# Patient Record
Sex: Male | Born: 1955 | Race: Black or African American | Hispanic: No | Marital: Single | Smoking: Current every day smoker
Health system: Southern US, Community
[De-identification: ages and names within clinical notes are randomized; demographics above are authoritative.]

## PROBLEM LIST (undated history)

## (undated) DIAGNOSIS — I10 Essential (primary) hypertension: Secondary | ICD-10-CM

## (undated) DIAGNOSIS — H9190 Unspecified hearing loss, unspecified ear: Secondary | ICD-10-CM

## (undated) HISTORY — PX: FOOT SURGERY: SHX648

## (undated) HISTORY — PX: FRACTURE SURGERY: SHX138

## (undated) HISTORY — DX: Essential (primary) hypertension: I10

---

## 2003-12-24 ENCOUNTER — Emergency Department (HOSPITAL_COMMUNITY): Admission: EM | Admit: 2003-12-24 | Discharge: 2003-12-24 | Payer: Self-pay

## 2005-05-27 ENCOUNTER — Inpatient Hospital Stay (HOSPITAL_COMMUNITY): Admission: AC | Admit: 2005-05-27 | Discharge: 2005-06-09 | Payer: Self-pay

## 2005-06-08 ENCOUNTER — Ambulatory Visit: Payer: Self-pay | Admitting: Critical Care Medicine

## 2005-07-25 ENCOUNTER — Ambulatory Visit: Payer: Self-pay | Admitting: Critical Care Medicine

## 2005-09-22 ENCOUNTER — Ambulatory Visit: Payer: Self-pay | Admitting: Critical Care Medicine

## 2006-01-18 ENCOUNTER — Ambulatory Visit: Payer: Self-pay | Admitting: Critical Care Medicine

## 2010-12-01 ENCOUNTER — Ambulatory Visit (INDEPENDENT_AMBULATORY_CARE_PROVIDER_SITE_OTHER): Payer: PRIVATE HEALTH INSURANCE | Admitting: Gastroenterology

## 2010-12-01 DIAGNOSIS — B182 Chronic viral hepatitis C: Secondary | ICD-10-CM

## 2010-12-08 ENCOUNTER — Other Ambulatory Visit: Payer: Self-pay | Admitting: Gastroenterology

## 2010-12-08 DIAGNOSIS — B182 Chronic viral hepatitis C: Secondary | ICD-10-CM
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
## 2010-12-20 ENCOUNTER — Other Ambulatory Visit: Payer: Self-pay | Admitting: Interventional Radiology

## 2010-12-20 ENCOUNTER — Ambulatory Visit (HOSPITAL_COMMUNITY)
Admission: RE | Admit: 2010-12-20 | Discharge: 2010-12-20 | Payer: PRIVATE HEALTH INSURANCE | Source: Ambulatory Visit | Attending: Gastroenterology | Admitting: Gastroenterology

## 2010-12-20 ENCOUNTER — Ambulatory Visit (HOSPITAL_COMMUNITY)
Admission: RE | Admit: 2010-12-20 | Discharge: 2010-12-20 | Disposition: A | Discharge status: Home / Self Care | Payer: PRIVATE HEALTH INSURANCE | Source: Ambulatory Visit | Attending: Gastroenterology | Admitting: Gastroenterology

## 2010-12-20 DIAGNOSIS — B1920 Unspecified viral hepatitis C without hepatic coma: Secondary | ICD-10-CM | POA: Insufficient documentation

## 2010-12-20 DIAGNOSIS — Z79899 Other long term (current) drug therapy: Secondary | ICD-10-CM | POA: Insufficient documentation

## 2010-12-20 DIAGNOSIS — I10 Essential (primary) hypertension: Secondary | ICD-10-CM | POA: Insufficient documentation

## 2010-12-20 DIAGNOSIS — B182 Chronic viral hepatitis C: Secondary | ICD-10-CM

## 2010-12-20 LAB — CBC
Hemoglobin: 12.1 g/dL — ABNORMAL LOW (ref 13.0–17.0)
MCH: 32.4 pg (ref 26.0–34.0)
MCV: 92.2 fL (ref 78.0–100.0)
Platelets: 191 10*3/uL (ref 150–400)
RBC: 3.73 MIL/uL — ABNORMAL LOW (ref 4.22–5.81)

## 2010-12-29 ENCOUNTER — Encounter: Payer: Self-pay | Admitting: Gastroenterology

## 2011-09-28 ENCOUNTER — Ambulatory Visit (INDEPENDENT_AMBULATORY_CARE_PROVIDER_SITE_OTHER): Payer: PRIVATE HEALTH INSURANCE | Admitting: Gastroenterology

## 2011-09-28 DIAGNOSIS — B182 Chronic viral hepatitis C: Secondary | ICD-10-CM

## 2011-10-13 NOTE — Progress Notes (Signed)
  NAME:  Austin Boone  MR#:  [id number]      DATE:  09/28/2011  DOB:  09/16/1963    cc: Consulting Physician: Gerome Apley, Montez Hageman., PA, [facility], [address], [place], [place] [address], Fax [phone]  Primary Care Physician: [facility], [address] [place], [place] [address], [place] [phone]  Referring Physician: Jonette Eva, MD, [facility], [address] [place], [place] [address], Fax [phone]    [facility] Medical Record Number [id number]  REASON FOR VISIT:  Follow up genotype 3a HCV, week 6 of treatment.   HISTORY:  The patient returns today unaccompanied. He reports that he has a rash, which improves with topical lotions. He is not that troubled by it and thinks the lotion is working. There are no symptoms to suggest decompensated or cryoglobulin mediated disease.   PAST MEDICAL HISTORY:  Significant for type 2 diabetes. He is monitoring his a.m. blood sugars on treatment.   CURRENT MEDICATIONS:  1. Accupril 20 mg b.i.d.  2. Nexium 40 mg b.i.d.  3. Metformin 1000 mg b.i.d.  4. Glucotrol 10 mg b.i.d.  5. Pegasys 180 mcg weekly.  6. Ribavirin 400 mg b.i.d.   ALLERGIES:  Denies.   HABITS:  Smoking never. Alcohol denies interval consumption.   REVIEW OF SYSTEMS:  All 10 systems reviewed today with the patient and they are negative other than which is mentioned above. His CES-D was incomplete.   PHYSICAL EXAMINATION:  Constitutional: Central obesity. Vital signs: Height 66 inches, weight 285 pounds, blood pressure 173/77, pulse 74, temperature 98 Fahrenheit.   ASSESSMENT:  The patient is a 56 year old gentleman with history of genotype 3a hepatitis C with radiographic imaging and lab testing to suggest cirrhosis. His start date for Pegasys, ribavirin was 08/18/2011. His week 4 HCV RNA was 90,201 international units per mL, or 4.96 log international units per mL. Obviously, this would be better if  it was undetectable. I would like to repeat his HCV RNA today to see what the trend is. Consideration could be given to discontinuation of therapy, if it has failed to decline further, however, if it is declining, consideration could be given to extending therapy to at least 36 weeks beyond the first negative HCV RNA.   In my discussion today with the patient, we discussed symptom management and his previous lab work. I explained the results of his previous HCV RNA.   PLAN:  1. CBC today.  2. HCV RNA today.  3. Return to clinic in 2 weeks' time.  4. Final Twinrix due in July 2013.  5. August ultrasound due in August 2013.  6. Repeat EGD by Dr. Darrick Penna, 05/26/2013-2016.                Brooke Dare, MD    ADDENDUM  Week 6 HCV RNA 10083 IU/mL 4 log IU/mL  403 .20947  D:  Thu May 09 20:20:39 2013 ; T:  Fri May 10 00:34:11 2013  Job #:  [id number]

## 2011-10-19 ENCOUNTER — Ambulatory Visit: Payer: PRIVATE HEALTH INSURANCE | Admitting: Gastroenterology

## 2012-02-27 ENCOUNTER — Encounter: Payer: Self-pay | Admitting: Gastroenterology

## 2012-02-27 ENCOUNTER — Telehealth: Payer: Self-pay | Admitting: Gastroenterology

## 2012-02-27 NOTE — Telephone Encounter (Signed)
Austin Boone mother called to ask about getting [name] (received in April and May 2013).  Now that the [facility] office is closed, I asked her to take him to the [facility] Dept office and wrote a letter explaining this.  I also asked my staff to arrange for an appt in [facility] in the coming year.

## 2014-09-10 DIAGNOSIS — I10 Essential (primary) hypertension: Secondary | ICD-10-CM | POA: Diagnosis not present

## 2014-09-10 DIAGNOSIS — B1920 Unspecified viral hepatitis C without hepatic coma: Secondary | ICD-10-CM | POA: Diagnosis not present

## 2014-09-10 DIAGNOSIS — J449 Chronic obstructive pulmonary disease, unspecified: Secondary | ICD-10-CM | POA: Diagnosis not present

## 2014-09-10 DIAGNOSIS — F17200 Nicotine dependence, unspecified, uncomplicated: Secondary | ICD-10-CM | POA: Diagnosis not present

## 2014-09-10 DIAGNOSIS — Z1389 Encounter for screening for other disorder: Secondary | ICD-10-CM | POA: Diagnosis not present

## 2014-10-15 DIAGNOSIS — B182 Chronic viral hepatitis C: Secondary | ICD-10-CM | POA: Diagnosis not present

## 2014-12-10 DIAGNOSIS — B182 Chronic viral hepatitis C: Secondary | ICD-10-CM | POA: Diagnosis not present

## 2015-01-14 DIAGNOSIS — B182 Chronic viral hepatitis C: Secondary | ICD-10-CM | POA: Diagnosis not present

## 2015-03-11 DIAGNOSIS — Z23 Encounter for immunization: Secondary | ICD-10-CM | POA: Diagnosis not present

## 2015-04-29 DIAGNOSIS — B182 Chronic viral hepatitis C: Secondary | ICD-10-CM | POA: Diagnosis not present

## 2015-08-23 DIAGNOSIS — Z125 Encounter for screening for malignant neoplasm of prostate: Secondary | ICD-10-CM | POA: Diagnosis not present

## 2015-08-23 DIAGNOSIS — I10 Essential (primary) hypertension: Secondary | ICD-10-CM | POA: Diagnosis not present

## 2015-08-23 DIAGNOSIS — J449 Chronic obstructive pulmonary disease, unspecified: Secondary | ICD-10-CM | POA: Diagnosis not present

## 2015-08-23 DIAGNOSIS — R636 Underweight: Secondary | ICD-10-CM | POA: Diagnosis not present

## 2015-08-23 DIAGNOSIS — Z1389 Encounter for screening for other disorder: Secondary | ICD-10-CM | POA: Diagnosis not present

## 2015-08-23 DIAGNOSIS — F17200 Nicotine dependence, unspecified, uncomplicated: Secondary | ICD-10-CM | POA: Diagnosis not present

## 2015-08-23 DIAGNOSIS — H9193 Unspecified hearing loss, bilateral: Secondary | ICD-10-CM | POA: Diagnosis not present

## 2015-09-01 ENCOUNTER — Other Ambulatory Visit: Payer: Self-pay

## 2015-09-01 NOTE — Patient Outreach (Signed)
[facility]N) Care Management  09/01/2015  Hoyle BarrWilliam H Cutsforth Jr. 1955-05-28 [id number]  Telephone call to patient regarding primary MD referral. Person answering phone states she has to take message for patient because he is hard of hearing. Person answering phone states patient lives approximately  20 miles from her. HIPAA compliant message with call back number was left for patient.   PLAN:  RNCM will attempt 2nd telephone outreach to patient within  1 week.   George InaDavina Verble Styron RN,BSN,CCM [facility] Telephonic  [phone]

## 2015-09-06 ENCOUNTER — Other Ambulatory Visit: Payer: Self-pay

## 2015-09-06 NOTE — Patient Outreach (Signed)
[facility]N) Care Management  09/06/2015  Lacretia NicksWilliam H Vantuyl Jr. [birth year] [id number]   Telephone call to patient regarding primary MD referral. Unable to reach patient. HIPAA compliant voice message left with call back phone number.   PLAN: RNCM will attempt 2nd telephone call to patient within 1 week  George InaDavina Quenten Nawaz RN,BSN,CCM [facility] Telephonic  [phone]

## 2015-09-09 ENCOUNTER — Ambulatory Visit: Payer: Self-pay

## 2015-09-10 ENCOUNTER — Other Ambulatory Visit: Payer: Self-pay

## 2015-09-10 DIAGNOSIS — H9190 Unspecified hearing loss, unspecified ear: Secondary | ICD-10-CM

## 2015-09-10 DIAGNOSIS — Z748 Other problems related to care provider dependency: Secondary | ICD-10-CM

## 2015-09-10 NOTE — Patient Outreach (Signed)
[facility](THN) Care Management  09/10/2015  [name] 07-14-1955 [id number]  Telephone call to patient regarding primary MD referral.  Person answering phone states patient is not available.  HIPAA compliant message left with call back phone number.   PLAN: RNCM will await return call from patient. RNCM will send outreach letter to patient to attempt contact.   George InaDavina Righteous Claiborne RN,BSN,CCM [facility] Telephonic  [phone]
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
## 2015-09-13 NOTE — Addendum Note (Signed)
Addended by: George InaGREEN, Tiyon Sanor E on: 09/13/2015 10:35 AM   Modules accepted: Orders

## 2015-09-16 ENCOUNTER — Other Ambulatory Visit: Payer: Self-pay | Admitting: *Deleted

## 2015-09-16 NOTE — Patient Outreach (Signed)
[facility](THN) Care Management  09/16/2015  Hoyle BarrWilliam H Avitabile Jr. 02-12-1956 [id number]    CSW contacted patient today and left HIPPA compliant message for return call.  CSW will try again if no return call in the next few days.   Reece LevyJanet Amillion Macchia, MSW, LCSW Clinical Social Worker  [facility] [phone]

## 2015-09-17 ENCOUNTER — Other Ambulatory Visit: Payer: Self-pay | Admitting: *Deleted

## 2015-09-21 ENCOUNTER — Other Ambulatory Visit: Payer: Self-pay | Admitting: *Deleted

## 2015-09-22 ENCOUNTER — Other Ambulatory Visit: Payer: Self-pay | Admitting: *Deleted

## 2015-09-23 ENCOUNTER — Other Ambulatory Visit: Payer: Self-pay | Admitting: *Deleted

## 2015-09-27 ENCOUNTER — Encounter: Payer: Self-pay | Admitting: *Deleted

## 2015-09-27 ENCOUNTER — Other Ambulatory Visit: Payer: Self-pay | Admitting: *Deleted

## 2015-09-27 NOTE — Patient Outreach (Signed)
[facility]) Care Management  [facility] Social Work  09/27/2015  Austin BOTTS Jr. [birth year] [id number]  Subjective:  Patient is hard of hearing and needs additional community support.   Encounter Medications:  No outpatient encounter prescriptions on file as of 09/27/2015.   No facility-administered encounter medications on file as of 09/27/2015.    Functional Status:  In your present state of health, do you have any difficulty performing the following activities: 09/27/2015  Hearing? Y  Vision? N  Difficulty concentrating or making decisions? N  Walking or climbing stairs? N  Dressing or bathing? N  Doing errands, shopping? N  Preparing Food and eating ? N  Using the Toilet? N  In the past six months, have you accidently leaked urine? N  Do you have problems with loss of bowel control? N  Managing your Medications? N  Managing your Finances? Y  Housekeeping or managing your Housekeeping? N    Fall/Depression Screening:  PHQ 2/9 Scores 09/27/2015  PHQ - 2 Score 0    Assessment:  CSW met with patient and his mother at mothers home today. Patient signed consent.  Patient is HOH and is unable to afford the copay for new hearing aids.  Patient's mother is able to provide transportation to his appointments including grocery store, but CSW will provide information on Medicaid transportation as well as Advance Directives for completion.   Plan:    [facility] CM Care Plan Problem One        Most Recent Value   Care Plan Problem One  Patient is hard of hearing/impaired   Role Documenting the Problem One  Clinical Social Worker   Care Plan for Problem One  Active   [facility] CM Short Term Goal #1 (0-30 days)  CSW and patient/mother will collaborate on possible resources for hearing impaired (hearing aids) over the next 30 days.   [facility] CM Short Term Goal #1 Start Date  09/27/15   Interventions for Short Term Goal #1  CSW discussed and will provide community resoures for potential  support in obtaining new hearing aids.    [facility] CM Short Term Goal #2 (0-30 days)  CSW to discuss and educate patient and provide Advance Directive packet within 30 days.   [facility] CM Short Term Goal #2 Start Date  09/27/15   Interventions for Short Term Goal #2  CSW to revisit and review Advance Directive packet with patient for completion in the next 90 days.    [facility] CM Care Plan Problem Two        Most Recent Value   Care Plan Problem Two  Patient does not have Advance Directives including HCPOA dpocuments   Role Documenting the Problem Two  Clinical Social Worker   Care Plan for Problem Two  Active   Interventions for Problem Two Long Term Goal   CSW to review, educate and provide assistance for Advance Directive completion in the nect 90 days.   [facility]N Long Term Goal (31-90) days  CSW to revisit and review Advance Directive packet with patient for completion in the next 90 days   [facility]N Long Term Goal Start Date  09/27/15   [facility] CM Short Term Goal #1 (0-30 days)  CSW to discuss and provide Advance Directives to patient inthe next 30 days   [facility] CM Short Term Goal #1 Start Date  09/27/15   Interventions for Short Term Goal #2   CSW educated patient on Advance Directives    [facility] CM Short Term  Goal #2 (0-30 days)  Patient verbalizes receipt of Advance Directives in the next 30 days.   [facility] CM Short Term Goal #2 Start Date  09/27/15   Interventions for Short Term Goal #2  CSW provides education and Advance Directive packet for eview/completion in the next 30days.

## 2015-10-01 NOTE — Patient Outreach (Signed)
[facility]N) Care Management  10/01/2015  Austin BarrWilliam H Umeda Jr. [birth year] [id number]   Request received from Reece LevyJanet Caldwell, LCSW to mail patient information on Advance Directives, medicaid transportation services and smoking cessation information. Information mailed today 10/01/15.

## 2015-10-25 ENCOUNTER — Other Ambulatory Visit: Payer: Self-pay | Admitting: *Deleted

## 2015-10-25 NOTE — Patient Outreach (Signed)
[facility]N) Care Management  10/25/2015  Hoyle BarrWilliam H Hsiung Jr. July 06, 1955 [id number]   CSW arrived at scheduled time to patient's mothers home. Family member indicated to CSW, "Chrissie NoaWilliam can't make the appointment today". CSW asked for patient and/or mother to call CSW back for update and further involvement/planning. CSW will reach out by phone later this week as well.   Reece LevyJanet Avry Monteleone, MSW, LCSW Clinical Social Worker  [facility] [phone]

## 2015-10-29 ENCOUNTER — Other Ambulatory Visit: Payer: Self-pay | Admitting: *Deleted

## 2015-10-29 NOTE — Patient Outreach (Signed)
[facility]N) Care Management  10/29/2015  Austin BarrWilliam H Johann Jr. Apr 13, 1956 [id number]   CSW attempting to call patient to confirm visit but unable to reach by phone. Due to recent NO SHOW visit, CSW will cancel visit and call again next week if no response.    Reece LevyJanet Pamelia Botto, MSW, LCSW Clinical Social Worker  [facility] [phone]

## 2015-12-03 ENCOUNTER — Other Ambulatory Visit: Payer: Self-pay | Admitting: *Deleted

## 2016-02-22 DIAGNOSIS — Z125 Encounter for screening for malignant neoplasm of prostate: Secondary | ICD-10-CM | POA: Diagnosis not present

## 2016-02-22 DIAGNOSIS — I10 Essential (primary) hypertension: Secondary | ICD-10-CM | POA: Diagnosis not present

## 2016-02-22 DIAGNOSIS — Z23 Encounter for immunization: Secondary | ICD-10-CM | POA: Diagnosis not present

## 2016-09-13 ENCOUNTER — Other Ambulatory Visit: Payer: Self-pay | Admitting: *Deleted

## 2017-08-24 ENCOUNTER — Ambulatory Visit
Admission: RE | Admit: 2017-08-24 | Discharge: 2017-08-24 | Disposition: A | Discharge status: Home / Self Care | Payer: Medicare Other | Source: Ambulatory Visit | Attending: Physician Assistant | Admitting: Physician Assistant

## 2017-08-24 ENCOUNTER — Encounter (HOSPITAL_COMMUNITY): Payer: Self-pay

## 2017-08-24 ENCOUNTER — Emergency Department (HOSPITAL_COMMUNITY): Payer: Medicare Other

## 2017-08-24 ENCOUNTER — Inpatient Hospital Stay (HOSPITAL_COMMUNITY): Payer: Medicare Other

## 2017-08-24 ENCOUNTER — Other Ambulatory Visit: Payer: Self-pay | Admitting: Physician Assistant

## 2017-08-24 ENCOUNTER — Other Ambulatory Visit: Payer: Self-pay

## 2017-08-24 ENCOUNTER — Inpatient Hospital Stay (HOSPITAL_COMMUNITY)
Admission: EM | Admit: 2017-08-24 | Discharge: 2017-08-30 | DRG: 470 | Disposition: A | Discharge status: Home Health | Payer: Medicare Other | Attending: Internal Medicine | Admitting: Internal Medicine

## 2017-08-24 DIAGNOSIS — S72091A Other fracture of head and neck of right femur, initial encounter for closed fracture: Principal | ICD-10-CM | POA: Diagnosis present

## 2017-08-24 DIAGNOSIS — S72001A Fracture of unspecified part of neck of right femur, initial encounter for closed fracture: Secondary | ICD-10-CM | POA: Diagnosis present

## 2017-08-24 DIAGNOSIS — W010XXA Fall on same level from slipping, tripping and stumbling without subsequent striking against object, initial encounter: Secondary | ICD-10-CM | POA: Diagnosis not present

## 2017-08-24 DIAGNOSIS — Z419 Encounter for procedure for purposes other than remedying health state, unspecified: Secondary | ICD-10-CM

## 2017-08-24 DIAGNOSIS — I69351 Hemiplegia and hemiparesis following cerebral infarction affecting right dominant side: Secondary | ICD-10-CM | POA: Diagnosis not present

## 2017-08-24 DIAGNOSIS — Y92009 Unspecified place in unspecified non-institutional (private) residence as the place of occurrence of the external cause: Secondary | ICD-10-CM

## 2017-08-24 DIAGNOSIS — F17210 Nicotine dependence, cigarettes, uncomplicated: Secondary | ICD-10-CM | POA: Diagnosis present

## 2017-08-24 DIAGNOSIS — E876 Hypokalemia: Secondary | ICD-10-CM | POA: Diagnosis present

## 2017-08-24 DIAGNOSIS — Z23 Encounter for immunization: Secondary | ICD-10-CM

## 2017-08-24 DIAGNOSIS — R262 Difficulty in walking, not elsewhere classified: Secondary | ICD-10-CM | POA: Diagnosis not present

## 2017-08-24 DIAGNOSIS — H9190 Unspecified hearing loss, unspecified ear: Secondary | ICD-10-CM | POA: Diagnosis present

## 2017-08-24 DIAGNOSIS — M25551 Pain in right hip: Secondary | ICD-10-CM

## 2017-08-24 DIAGNOSIS — I471 Supraventricular tachycardia: Secondary | ICD-10-CM | POA: Diagnosis present

## 2017-08-24 DIAGNOSIS — S72009A Fracture of unspecified part of neck of unspecified femur, initial encounter for closed fracture: Secondary | ICD-10-CM | POA: Diagnosis present

## 2017-08-24 DIAGNOSIS — I619 Nontraumatic intracerebral hemorrhage, unspecified: Secondary | ICD-10-CM | POA: Diagnosis not present

## 2017-08-24 DIAGNOSIS — I10 Essential (primary) hypertension: Secondary | ICD-10-CM | POA: Diagnosis present

## 2017-08-24 HISTORY — DX: Unspecified hearing loss, unspecified ear: H91.90

## 2017-08-24 LAB — BASIC METABOLIC PANEL
ANION GAP: 12 (ref 5–15)
BUN: 8 mg/dL (ref 6–20)
CHLORIDE: 100 mmol/L — AB (ref 101–111)
CO2: 25 mmol/L (ref 22–32)
Calcium: 9.2 mg/dL (ref 8.9–10.3)
Creatinine, Ser: 0.81 mg/dL (ref 0.61–1.24)
Glucose, Bld: 99 mg/dL (ref 65–99)
Potassium: 3.4 mmol/L — ABNORMAL LOW (ref 3.5–5.1)
Sodium: 137 mmol/L (ref 135–145)

## 2017-08-24 LAB — TYPE AND SCREEN
ABO/RH(D): O POS
ANTIBODY SCREEN: NEGATIVE

## 2017-08-24 LAB — PROTIME-INR
INR: 1.11
Prothrombin Time: 14.2 seconds (ref 11.4–15.2)

## 2017-08-24 LAB — CBC WITH DIFFERENTIAL/PLATELET
BASOS ABS: 0.1 10*3/uL (ref 0.0–0.1)
Basophils Relative: 0 %
EOS PCT: 1 %
Eosinophils Absolute: 0.1 10*3/uL (ref 0.0–0.7)
HCT: 37.2 % — ABNORMAL LOW (ref 39.0–52.0)
HEMOGLOBIN: 12.9 g/dL — AB (ref 13.0–17.0)
LYMPHS ABS: 2 10*3/uL (ref 0.7–4.0)
LYMPHS PCT: 17 %
MCH: 32.2 pg (ref 26.0–34.0)
MCHC: 34.7 g/dL (ref 30.0–36.0)
MCV: 92.8 fL (ref 78.0–100.0)
Monocytes Absolute: 1.2 10*3/uL — ABNORMAL HIGH (ref 0.1–1.0)
Monocytes Relative: 10 %
NEUTROS ABS: 8.2 10*3/uL — AB (ref 1.7–7.7)
NEUTROS PCT: 72 %
Platelets: 198 10*3/uL (ref 150–400)
RBC: 4.01 MIL/uL — AB (ref 4.22–5.81)
RDW: 12.2 % (ref 11.5–15.5)
WBC: 11.5 10*3/uL — ABNORMAL HIGH (ref 4.0–10.5)

## 2017-08-24 LAB — ABO/RH: ABO/RH(D): O POS

## 2017-08-24 MED ORDER — FENTANYL CITRATE (PF) 100 MCG/2ML IJ SOLN
50.0000 ug | Freq: Once | INTRAMUSCULAR | Status: AC
  Administered 2017-08-24: 50 ug via INTRAMUSCULAR
  Filled 2017-08-24: qty 2

## 2017-08-24 MED ORDER — ONDANSETRON HCL 4 MG/2ML IJ SOLN
4.0000 mg | Freq: Four times a day (QID) | INTRAMUSCULAR | Status: DC | PRN
Start: 2017-08-24 — End: 2017-08-30
  Administered 2017-08-27: 4 mg via INTRAVENOUS

## 2017-08-24 MED ORDER — SODIUM CHLORIDE 0.9% FLUSH
3.0000 mL | Freq: Two times a day (BID) | INTRAVENOUS | Status: DC
  Administered 2017-08-24 – 2017-08-29 (×7): 3 mL via INTRAVENOUS

## 2017-08-24 MED ORDER — ONDANSETRON HCL 4 MG PO TABS: 4.0000 mg | ORAL_TABLET | Freq: Four times a day (QID) | ORAL | Status: DC | PRN

## 2017-08-24 MED ORDER — KCL IN DEXTROSE-NACL 20-5-0.45 MEQ/L-%-% IV SOLN
INTRAVENOUS | Status: AC
  Administered 2017-08-24 – 2017-08-25 (×2): via INTRAVENOUS
  Filled 2017-08-24 (×2): qty 1000

## 2017-08-24 MED ORDER — HYDRALAZINE HCL 20 MG/ML IJ SOLN
10.0000 mg | Freq: Three times a day (TID) | INTRAMUSCULAR | Status: DC | PRN
  Administered 2017-08-27: 10 mg via INTRAVENOUS

## 2017-08-24 MED ORDER — HYDROCHLOROTHIAZIDE 25 MG PO TABS
12.5000 mg | ORAL_TABLET | Freq: Every day | ORAL | Status: DC
  Administered 2017-08-24 – 2017-08-30 (×6): 12.5 mg via ORAL
  Filled 2017-08-24 (×6): qty 1

## 2017-08-24 MED ORDER — ACETAMINOPHEN 650 MG RE SUPP: 650.0000 mg | Freq: Four times a day (QID) | RECTAL | Status: DC | PRN

## 2017-08-24 MED ORDER — FENTANYL CITRATE (PF) 100 MCG/2ML IJ SOLN
50.0000 ug | INTRAMUSCULAR | Status: AC | PRN
  Administered 2017-08-24 (×2): 50 ug via INTRAVENOUS
  Filled 2017-08-24 (×2): qty 2

## 2017-08-24 MED ORDER — ACETAMINOPHEN 325 MG PO TABS
650.0000 mg | ORAL_TABLET | Freq: Four times a day (QID) | ORAL | Status: DC | PRN
  Administered 2017-08-25 – 2017-08-30 (×14): 650 mg via ORAL
  Filled 2017-08-24 (×15): qty 2

## 2017-08-24 NOTE — ED Notes (Signed)
Patient transported to CT 

## 2017-08-24 NOTE — ED Triage Notes (Signed)
Pt had mechanical fall yesterday where he injured his right hip. Pt had x-ray and sent here by pcp for possible hip fx. VSS. Axox4.

## 2017-08-24 NOTE — ED Notes (Signed)
Pt need room per Dr. Criss AlvineGoldston, charge RN notified and slotted to go into room E43 when available

## 2017-08-24 NOTE — H&P (Addendum)
[facility] History and Physical  Austin Boone. ZOX:[id number] DOB: 04-01-1956 DOA: 08/24/2017  Referring physician:  PCP: Lonie Peak, PA-C   Chief Complaint: "I fell on my side."  HPI: Austin Boone. is a 62 y.o. male past medical history significant for hypertension and stroke presents the emergency room with right hip pain.  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 Patient had a mechanical fall at home.  Denies any head injury. Went to see PCP while on crutches. Was sent to ED for eval.  ED course: Seen by ortho. Admit for repair. Hospitlalist consulted for admit.   Review of Systems:  As per HPI otherwise 10 point review of systems negative.    Past Medical History:  Diagnosis Date  . Hypertension    Past Surgical History:  Procedure Laterality Date  . FRACTURE SURGERY     Social History:  reports that he has been smoking cigarettes.  He has been smoking about 0.25 packs per day. He does not have any smokeless tobacco history on file. He reports that he drinks alcohol. He reports that he does not use drugs.  No Known Allergies  History reviewed. No pertinent family history.   Prior to Admission medications   Medication Sig Start Date End Date Taking? Authorizing Provider  hydrochlorothiazide (HYDRODIURIL) 12.5 MG tablet Take 12.5 mg by mouth daily. 08/18/17  Yes [provider]   Physical Exam: Vitals:   08/24/17 1815 08/24/17 1900 08/24/17 1915 08/24/17 1959  BP: (!) 167/109 (!) 166/101 (!) 177/108 (!) 155/108  Pulse: 60 67 72 70  Resp: 19 19 18 19   Temp:    98.8 F (37.1 C)  TempSrc:    Oral  SpO2: 95% 94% 96% 99%  Weight:      Height:        Wt Readings from Last 3 Encounters:  08/24/17 59 kg (130 lb)    General:  Appears calm and comfortable; A&Ox3 Eyes:  PERRL, EOMI, normal lids, iris ENT:  grossly normal hearing, lips & tongue Neck:  no LAD, masses or  thyromegaly Cardiovascular:  RRR, no m/r/g. No LE edema.  Respiratory:  CTA bilaterally, no w/r/r. Normal respiratory effort. Abdomen:  soft, ntnd Skin:  no rash or induration seen on limited exam Musculoskeletal:  grossly normal tone BUE/BLE; R leg shorter than left; tender R hip Psychiatric:  grossly normal mood and affect, speech fluent and appropriate Neurologic:  CN 2-12 grossly intact, moves all extremities in coordinated fashion.          Labs on Admission:  Basic Metabolic Panel: Recent Labs  Lab 08/24/17 1351  NA 137  K 3.4*  CL 100*  CO2 25  GLUCOSE 99  BUN 8  CREATININE 0.81  CALCIUM 9.2   Liver Function Tests: No results for input(s): AST, ALT, ALKPHOS, BILITOT, PROT, ALBUMIN in the last 168 hours. No results for input(s): LIPASE, AMYLASE in the last 168 hours. No results for input(s): AMMONIA in the last 168 hours. CBC: Recent Labs  Lab 08/24/17 1351  WBC 11.5*  NEUTROABS 8.2*  HGB 12.9*  HCT 37.2*  MCV 92.8  PLT 198   Cardiac Enzymes: No results for input(s): CKTOTAL, CKMB, CKMBINDEX, TROPONINI in the last 168 hours.  BNP (last 3 results) No results for input(s): BNP in the last 8760 hours.  ProBNP (last 3 results) No results for input(s): PROBNP in the last 8760 hours.   Serum creatinine: 0.81 mg/dL  08/24/17 1351 Estimated creatinine clearance: 77.9 mL/min  CBG: No results for input(s): GLUCAP in the last 168 hours.  Radiological Exams on Admission: Dg Chest 2 View  Result Date: 08/24/2017 CLINICAL DATA:  Preop for right femur fracture repair. EXAM: CHEST - 2 VIEW COMPARISON:  Radiographs of August 21, 2007. FINDINGS: The heart size and mediastinal contours are within normal limits. Both lungs are clear. No pneumothorax or pleural effusion is noted. Old left rib fractures are noted. IMPRESSION: No active cardiopulmonary disease. Electronically Signed   By: Lupita RaiderJames  Green Jr, M.D.   On: 08/24/2017 14:56   Dg Pelvis Portable  Result Date:  08/24/2017 CLINICAL DATA:  Displaced right femoral neck fracture. EXAM: PORTABLE PELVIS 1-2 VIEWS COMPARISON:  08/24/2017 right hip and femur radiographs as well as CT of the right hip. FINDINGS: Patient is slightly oblique this AP portable supine view of the pelvis. Femoral heads are seated within their acetabular components. Redemonstration of known right femoral neck fracture involving the basicervical portion medially with impaction along the superolateral aspect of the femoral head-neck junction. The pubic rami, bony pelvis and left proximal femur are intact. Acetabular components are intact. The degree of minimal offset involving the base of the right femoral neck is less apparent on this study. IMPRESSION: Acute right femoral neck fracture along the basicervical portion medially with impaction of the femoral neck on femoral head superolaterally. No joint dislocation. The degree of offset involving the fracture at the base of the right femoral neck is less conspicuous on this study. Electronically Signed   By: Tollie Ethavid  Kwon M.D.   On: 08/24/2017 18:57   Ct Hip Right Wo Contrast  Result Date: 08/24/2017 CLINICAL DATA:  62 year old male status post fall over dog yesterday presents with right hip pain. EXAM: CT OF THE RIGHT HIP WITHOUT CONTRAST TECHNIQUE: Multidetector CT imaging of the right hip was performed according to the standard protocol. Multiplanar CT image reconstructions were also generated. COMPARISON:  Radiographs from 08/24/2017 of the right hip. FINDINGS: Bones/Joint/Cartilage Acute, basicervical fracture of the right femur with impaction of the femoral neck on femoral head along the superolateral aspect also noted, series 6/39. There is 1.3 mm of lateral displacement of the main distal fracture fragment at the base of the femoral neck in addition. Pubic rami appear intact. Linear lucency with sclerotic margins compatible with a vascular channel is seen of the right superior pubic ramus, series  6/26. The femoral head is seated within its acetabular component. Degenerative acetabular spurring is noted off the of the acetabulum. No hip joint effusion. Intact greater and lesser trochanters. Small focus of fibrocystic change at the femoral head-neck juncture, series 7, image 32 which can be seen as sequela of femoroacetabular impingement. Ligaments Suboptimally assessed by CT. Muscles and Tendons No intramuscular hemorrhage or atrophy. Soft tissues Subcutaneous fatty induration along the lateral aspect of the right hip consistent with a contusion. Partially included right-sided hydrocele within the scrotum. IMPRESSION: 1. Acute comminuted fracture of the right femoral neck with minimally displaced basicervical fracture component as well as impaction of the femoral head upon the femoral head superolaterally. 2. No joint dislocation or joint effusion. 3. Partially included right sided hydrocele within the included scrotal sac. Electronically Signed   By: Tollie Ethavid  Kwon M.D.   On: 08/24/2017 18:00   Dg Hip Unilat With Pelvis 2-3 Views Right  Result Date: 08/24/2017 CLINICAL DATA:  Right hip pain after falling yesterday. EXAM: DG HIP (WITH OR WITHOUT PELVIS) 2-3V RIGHT COMPARISON:  05/27/2005  FINDINGS: Suspicion of minimally displaced to nondisplaced femoral neck fracture. Of suggest confirmation with MRI if able. If not, CT could be second choice. No other abnormality of the right hemipelvis. IMPRESSION: Suspicion of minimally displaced to nondisplaced femoral neck fracture. Recommend confirmation with MRI. If not able, CT would be second choice. Electronically Signed   By: Paulina Fusi M.D.   On: 08/24/2017 12:26   Dg Femur Min 2 Views Right  Result Date: 08/24/2017 CLINICAL DATA:  Right hip pain after fall yesterday. EXAM: RIGHT FEMUR 2 VIEWS COMPARISON:  None. FINDINGS: Minimal displaced fracture is seen involving the proximal right femoral neck. No dislocation is noted. No soft tissue abnormality is noted.  IMPRESSION: Minimally displaced proximal right femoral neck fracture. Electronically Signed   By: Lupita Raider, M.D.   On: 08/24/2017 14:57    EKG: Independently reviewed. Sinus brady.  Assessment/Plan Active Problems:   Hip fracture (HCC)   Hip fracture NPO after 12a Likely to OR in AM possibly Fentanyl for pain control  Hypertension When necessary hydralazine 10 mg IV as needed for severe blood pressure Cont hctz  Code Status: FC  DVT Prophylaxis: SCDs Family Communication: wife at bedside Disposition Plan: Pending Improvement  Status: Tele, Inpt  Haydee Salter, MD Family Medicine [facility] [email or web address] Password [email or web address]

## 2017-08-24 NOTE — H&P (View-Only) (Signed)
ORTHOPAEDIC CONSULTATION  REQUESTING PHYSICIAN: Pricilla LovelessGoldston, Scott, MD  PCP:  Lonie Peakonroy, Nathan, PA-C  Chief Complaint: Right hip fracture  HPI: Hoyle BarrWilliam H Guild Boone. is a 62 y.o. male who complains of pain with ambulation and inability to put full weight on his right leg following a fall.  He ambulates without any assistive devices at baseline.  However he does wear a 4 inch heel lift on his right shoe after having a segment of his right tibia resected following a traumatic open injury.  Otherwise, he is independent with all ADLs and lives alone.  He does admit to smoking less than half a pack of cigarettes a day.  Otherwise he only endorses a history of hypertension and a history of hepatitis C which is currently in remission after being successfully treated..  Past Medical History:  Diagnosis Date  . Hypertension    Past Surgical History:  Procedure Laterality Date  . FRACTURE SURGERY     Social History   Socioeconomic History  . Marital status: Single    Spouse name: Not on file  . Number of children: Not on file  . Years of education: Not on file  . Highest education level: Not on file  Occupational History  . Not on file  Social Needs  . Financial resource strain: Not on file  . Food insecurity:    Worry: Not on file    Inability: Not on file  . Transportation needs:    Medical: Not on file    Non-medical: Not on file  Tobacco Use  . Smoking status: Current Every Day Smoker    Packs/day: 0.25    Types: Cigarettes  Substance and Sexual Activity  . Alcohol use: Yes    Comment: rare  . Drug use: Never  . Sexual activity: Not on file  Lifestyle  . Physical activity:    Days per week: Not on file    Minutes per session: Not on file  . Stress: Not on file  Relationships  . Social connections:    Talks on phone: Not on file    Gets together: Not on file    Attends religious service: Not on file    Active member of club or organization: Not on file    Attends  meetings of clubs or organizations: Not on file    Relationship status: Not on file  Other Topics Concern  . Not on file  Social History Narrative  . Not on file   History reviewed. No pertinent family history. No Known Allergies Prior to Admission medications   Not on File   Dg Chest 2 View  Result Date: 08/24/2017 CLINICAL DATA:  Preop for right femur fracture repair. EXAM: CHEST - 2 VIEW COMPARISON:  Radiographs of August 21, 2007. FINDINGS: The heart size and mediastinal contours are within normal limits. Both lungs are clear. No pneumothorax or pleural effusion is noted. Old left rib fractures are noted. IMPRESSION: No active cardiopulmonary disease. Electronically Signed   By: Lupita RaiderJames  Green Boone, M.D.   On: 08/24/2017 14:56   Dg Hip Unilat With Pelvis 2-3 Views Right  Result Date: 08/24/2017 CLINICAL DATA:  Right hip pain after falling yesterday. EXAM: DG HIP (WITH OR WITHOUT PELVIS) 2-3V RIGHT COMPARISON:  05/27/2005 FINDINGS: Suspicion of minimally displaced to nondisplaced femoral neck fracture. Of suggest confirmation with MRI if able. If not, CT could be second choice. No other abnormality of the right hemipelvis. IMPRESSION: Suspicion of minimally displaced to nondisplaced femoral neck fracture.  Recommend confirmation with MRI. If not able, CT would be second choice. Electronically Signed   By: Paulina Fusi M.D.   On: 08/24/2017 12:26   Dg Femur Min 2 Views Right  Result Date: 08/24/2017 CLINICAL DATA:  Right hip pain after fall yesterday. EXAM: RIGHT FEMUR 2 VIEWS COMPARISON:  None. FINDINGS: Minimal displaced fracture is seen involving the proximal right femoral neck. No dislocation is noted. No soft tissue abnormality is noted. IMPRESSION: Minimally displaced proximal right femoral neck fracture. Electronically Signed   By: Lupita Raider, M.D.   On: 08/24/2017 14:57    Positive ROS: All other systems have been reviewed and were otherwise negative with the exception of those  mentioned in the HPI and as above.  Physical Exam: General: Alert, no acute distress Cardiovascular: No pedal edema Respiratory: No cyanosis, no use of accessory musculature GI: No organomegaly, abdomen is soft and non-tender Skin: No lesions in the area of chief complaint Neurologic: Sensation intact distally Psychiatric: Patient is competent for consent with normal mood and affect Lymphatic: No axillary or cervical lymphadenopathy  MUSCULOSKELETAL:   Right lower extremity: Chronic wound noted in the anterior medial aspect of the distal tibia.  There are no signs of induration or drainage from this.  This appears well-healed.  The skin itself is warm and well-perfused.  At the foot he endorses sensation in the sural and saphenous as well as deep and superficial peroneal nerves.  In the tibial nerve he has decreased sensation.  He has dorsiflexion and plantar flexion.  He has a palpable 1+ dorsalis pedis pulse.  Assessment: 1. Right femoral neck fracture  Plan: -I have reviewed the plain radiographs as well as CT scan which demonstrated a right femoral neck fracture with questionable involvement of the femoral head as well.  This will need operative fixation.  At this time surgery will not occur today.  It may occur over the weekend if we can organize planning and scheduling.  Otherwise plan will be for Monday operative management.  I have asked my partner Dr. Linna Caprice was 1 of our hip specialist to review the images and help make recommendations on treatment, he may assume definitive care.  We will update the primary team as soon as we know when surgery will be definitely planned. -Otherwise, nonweightbearing to the right lower extremity.  He may have a diet today.  Please make n.p.o. tonight at midnight in case we can schedule surgery for Saturday.  Again, we will update as soon as we have a definitive plan for that.  Please hold chemical DVT prophylaxis.  Recommend SCDs  bilateral. -Appreciate the medicine service for their admission and management.    Yolonda Kida, MD Cell [phone]    08/24/2017 5:35 PM

## 2017-08-24 NOTE — ED Provider Notes (Signed)
Patient placed in Quick Look pathway, seen and evaluated   Chief Complaint: Right hip pain  HPI: Patient presenting from outpatient provider with plain films concerning for femoral head fracture with radiology requesting MRI for confirmation and CT if unable to get MRI as a second choice.  Patient states that he tripped and fell in his right hip and has pain down his right leg as well.  ROS: Denies head trauma, loss of consciousness, anticoagulant use or other injuries.  Physical Exam:   Gen: No distress  Neuro: Awake and Alert  Skin: Warm    Focused Exam: Tender to palpation of the right hip and right femur.  No tenderness palpation of the right knee.  No midline tenderness to palpation of the spine.  Full range of motion at the knee.   Initiation of care has begun. The patient has been counseled on the process, plan, and necessity for staying for the completion/evaluation, and the remainder of the medical screening examination    Gregary CromerMitchell, Shavanna Furnari B, PA-C 08/24/17 1359    Tegeler, Canary Brimhristopher J, MD 08/24/17 [phone]

## 2017-08-24 NOTE — ED Provider Notes (Signed)
[facility]   CSN: [id number] Arrival date & time: 08/24/17  1326     History   Chief Complaint Chief Complaint  Patient presents with  . Hip Pain    HPI Austin BarrWilliam H Miyasato Jr. is a 62 y.o. male.  HPI  62 year old male with a history of hypertension presents with right hip pain after a fall last night around 5:30 PM.  Patient is very hard of hearing which limits the history.  The patient has been having continuous pain in his right hip since.  He is unable to bear weight and has been using crutches.  Went to see a doctor this morning and was sent for x-rays.  The x-ray was equivocal and he was told he needs an MRI.  Patient denies any weakness or numbness in his extremity.  No other trauma or head injury.  Was given IV fentanyl upon triage and feels better.  Patient used to have hepatitis C but this has been cured with treatment.  Past Medical History:  Diagnosis Date  . Hypertension     There are no active problems to display for this patient.   Past Surgical History:  Procedure Laterality Date  . FRACTURE SURGERY          Home Medications    Prior to Admission medications   Not on File    Family History History reviewed. No pertinent family history.  Social History Social History   Tobacco Use  . Smoking status: Current Every Day Smoker    Packs/day: 0.25    Types: Cigarettes  Substance Use Topics  . Alcohol use: Yes    Comment: rare  . Drug use: Never     Allergies   Patient has no known allergies.   Review of Systems Review of Systems  Musculoskeletal: Positive for arthralgias.  Neurological: Negative for weakness and numbness.  All other systems reviewed and are negative.    Physical Exam Updated Vital Signs BP (!) 196/106 (BP Location: Left Arm)   Pulse 64   Temp 98.6 F (37 C) (Oral)   Resp 16   Ht 5' 9.5" (1.765 m)   Wt 59 kg (130 lb)   SpO2 100%   BMI 18.92 kg/m    Physical Exam  Constitutional: He is oriented to person, place, and time. He appears well-developed and well-nourished.  HENT:  Head: Normocephalic and atraumatic.  Right Ear: External ear normal.  Left Ear: External ear normal.  Nose: Nose normal.  Eyes: Right eye exhibits no discharge. Left eye exhibits no discharge.  Neck: Neck supple.  Cardiovascular: Normal rate and regular rhythm.  Pulmonary/Chest: Effort normal and breath sounds normal.  Abdominal: Soft. He exhibits no distension. There is no tenderness.  Musculoskeletal: He exhibits no edema.       Right hip: He exhibits tenderness. He exhibits normal range of motion.       Right knee: No tenderness found.       Right upper leg: He exhibits no tenderness.  Neurological: He is alert and oriented to person, place, and time.  Skin: Skin is warm and dry.  Nursing note and vitals reviewed.    ED Treatments / Results  Labs (all labs ordered are listed, but only abnormal results are displayed) Labs Reviewed  BASIC METABOLIC PANEL - Abnormal; Notable for the following components:      Result Value   Potassium 3.4 (*)    Chloride 100 (*)    All  other components within normal limits  CBC WITH DIFFERENTIAL/PLATELET - Abnormal; Notable for the following components:   WBC 11.5 (*)    RBC 4.01 (*)    Hemoglobin 12.9 (*)    HCT 37.2 (*)    Neutro Abs 8.2 (*)    Monocytes Absolute 1.2 (*)    All other components within normal limits  PROTIME-INR  TYPE AND SCREEN    EKG None  Radiology Dg Chest 2 View  Result Date: 08/24/2017 CLINICAL DATA:  Preop for right femur fracture repair. EXAM: CHEST - 2 VIEW COMPARISON:  Radiographs of August 21, 2007. FINDINGS: The heart size and mediastinal contours are within normal limits. Both lungs are clear. No pneumothorax or pleural effusion is noted. Old left rib fractures are noted. IMPRESSION: No active cardiopulmonary disease. Electronically Signed   By: Lupita Raider, M.D.   On:  08/24/2017 14:56   Dg Hip Unilat With Pelvis 2-3 Views Right  Result Date: 08/24/2017 CLINICAL DATA:  Right hip pain after falling yesterday. EXAM: DG HIP (WITH OR WITHOUT PELVIS) 2-3V RIGHT COMPARISON:  05/27/2005 FINDINGS: Suspicion of minimally displaced to nondisplaced femoral neck fracture. Of suggest confirmation with MRI if able. If not, CT could be second choice. No other abnormality of the right hemipelvis. IMPRESSION: Suspicion of minimally displaced to nondisplaced femoral neck fracture. Recommend confirmation with MRI. If not able, CT would be second choice. Electronically Signed   By: Paulina Fusi M.D.   On: 08/24/2017 12:26   Dg Femur Min 2 Views Right  Result Date: 08/24/2017 CLINICAL DATA:  Right hip pain after fall yesterday. EXAM: RIGHT FEMUR 2 VIEWS COMPARISON:  None. FINDINGS: Minimal displaced fracture is seen involving the proximal right femoral neck. No dislocation is noted. No soft tissue abnormality is noted. IMPRESSION: Minimally displaced proximal right femoral neck fracture. Electronically Signed   By: Lupita Raider, M.D.   On: 08/24/2017 14:57    Procedures Procedures (including critical care time)  Medications Ordered in ED Medications  fentaNYL (SUBLIMAZE) injection 50 mcg (has no administration in time range)  fentaNYL (SUBLIMAZE) injection 50 mcg (50 mcg Intramuscular Given 08/24/17 1417)     Initial Impression / Assessment and Plan / ED Course  I have reviewed the triage vital signs and the nursing notes.  Pertinent labs & imaging results that were available during my care of the patient were reviewed by me and considered in my medical decision making (see chart for details).     Femur x-ray confirms right femoral neck fracture.  X-ray has been reviewed by Dr. Aundria Rud.  Recommend CT hip. Medicine admit. Surgery won't be tonight. Ok to eat for now. Dr. Melynda Ripple to admit.  Final Clinical Impressions(s) / ED Diagnoses   Final diagnoses:  Closed displaced  fracture of right femoral neck [facility])    ED Discharge Orders    None       Pricilla Loveless, MD 08/24/17 1801

## 2017-08-24 NOTE — ED Notes (Signed)
Admitting at bedside 

## 2017-08-24 NOTE — Progress Notes (Signed)
Orthopedic note:  I have reviewed the CT scan and plain films with my partner Dr. Linna CapriceSwinteck.  It is his recommendation that Austin Boone receive a total hip arthroplasty given the comminution and the femoral neck fracture.  the plan will be for total hip arthroplasty to be performed on Monday.  Austin Boone is okay for chemical DVT prophylaxis to be discontinued after Sunday morning.   maintain SCDs.  He may have a diet until Sunday night at midnight.

## 2017-08-24 NOTE — Consult Note (Signed)
ORTHOPAEDIC CONSULTATION  REQUESTING PHYSICIAN: Pricilla LovelessGoldston, Scott, MD  PCP:  Lonie Peakonroy, Nathan, PA-C  Chief Complaint: Right hip fracture  HPI: Austin Boone. is a 62 y.o. male who complains of pain with ambulation and inability to put full weight on his right leg following a fall.  He ambulates without any assistive devices at baseline.  However he does wear a 4 inch heel lift on his right shoe after having a segment of his right tibia resected following a traumatic open injury.  Otherwise, he is independent with all ADLs and lives alone.  He does admit to smoking less than half a pack of cigarettes a day.  Otherwise he only endorses a history of hypertension and a history of hepatitis C which is currently in remission after being successfully treated..  Past Medical History:  Diagnosis Date  . Hypertension    Past Surgical History:  Procedure Laterality Date  . FRACTURE SURGERY     Social History   Socioeconomic History  . Marital status: Single    Spouse name: Not on file  . Number of children: Not on file  . Years of education: Not on file  . Highest education level: Not on file  Occupational History  . Not on file  Social Needs  . Financial resource strain: Not on file  . Food insecurity:    Worry: Not on file    Inability: Not on file  . Transportation needs:    Medical: Not on file    Non-medical: Not on file  Tobacco Use  . Smoking status: Current Every Day Smoker    Packs/day: 0.25    Types: Cigarettes  Substance and Sexual Activity  . Alcohol use: Yes    Comment: rare  . Drug use: Never  . Sexual activity: Not on file  Lifestyle  . Physical activity:    Days per week: Not on file    Minutes per session: Not on file  . Stress: Not on file  Relationships  . Social connections:    Talks on phone: Not on file    Gets together: Not on file    Attends religious service: Not on file    Active member of club or organization: Not on file    Attends  meetings of clubs or organizations: Not on file    Relationship status: Not on file  Other Topics Concern  . Not on file  Social History Narrative  . Not on file   History reviewed. No pertinent family history. No Known Allergies Prior to Admission medications   Not on File   Dg Chest 2 View  Result Date: 08/24/2017 CLINICAL DATA:  Preop for right femur fracture repair. EXAM: CHEST - 2 VIEW COMPARISON:  Radiographs of August 21, 2007. FINDINGS: The heart size and mediastinal contours are within normal limits. Both lungs are clear. No pneumothorax or pleural effusion is noted. Old left rib fractures are noted. IMPRESSION: No active cardiopulmonary disease. Electronically Signed   By: Lupita RaiderJames  Green Boone, M.D.   On: 08/24/2017 14:56   Dg Hip Unilat With Pelvis 2-3 Views Right  Result Date: 08/24/2017 CLINICAL DATA:  Right hip pain after falling yesterday. EXAM: DG HIP (WITH OR WITHOUT PELVIS) 2-3V RIGHT COMPARISON:  05/27/2005 FINDINGS: Suspicion of minimally displaced to nondisplaced femoral neck fracture. Of suggest confirmation with MRI if able. If not, CT could be second choice. No other abnormality of the right hemipelvis. IMPRESSION: Suspicion of minimally displaced to nondisplaced femoral neck fracture.  Recommend confirmation with MRI. If not able, CT would be second choice. Electronically Signed   By: Paulina Fusi M.D.   On: 08/24/2017 12:26   Dg Femur Min 2 Views Right  Result Date: 08/24/2017 CLINICAL DATA:  Right hip pain after fall yesterday. EXAM: RIGHT FEMUR 2 VIEWS COMPARISON:  None. FINDINGS: Minimal displaced fracture is seen involving the proximal right femoral neck. No dislocation is noted. No soft tissue abnormality is noted. IMPRESSION: Minimally displaced proximal right femoral neck fracture. Electronically Signed   By: Lupita Raider, M.D.   On: 08/24/2017 14:57    Positive ROS: All other systems have been reviewed and were otherwise negative with the exception of those  mentioned in the HPI and as above.  Physical Exam: General: Alert, no acute distress Cardiovascular: No pedal edema Respiratory: No cyanosis, no use of accessory musculature GI: No organomegaly, abdomen is soft and non-tender Skin: No lesions in the area of chief complaint Neurologic: Sensation intact distally Psychiatric: Patient is competent for consent with normal mood and affect Lymphatic: No axillary or cervical lymphadenopathy  MUSCULOSKELETAL:   Right lower extremity: Chronic wound noted in the anterior medial aspect of the distal tibia.  There are no signs of induration or drainage from this.  This appears well-healed.  The skin itself is warm and well-perfused.  At the foot he endorses sensation in the sural and saphenous as well as deep and superficial peroneal nerves.  In the tibial nerve he has decreased sensation.  He has dorsiflexion and plantar flexion.  He has a palpable 1+ dorsalis pedis pulse.  Assessment: 1. Right femoral neck fracture  Plan: -I have reviewed the plain radiographs as well as CT scan which demonstrated a right femoral neck fracture with questionable involvement of the femoral head as well.  This will need operative fixation.  At this time surgery will not occur today.  It may occur over the weekend if we can organize planning and scheduling.  Otherwise plan will be for Monday operative management.  I have asked my partner Dr. Linna Caprice was 1 of our hip specialist to review the images and help make recommendations on treatment, he may assume definitive care.  We will update the primary team as soon as we know when surgery will be definitely planned. -Otherwise, nonweightbearing to the right lower extremity.  He may have a diet today.  Please make n.p.o. tonight at midnight in case we can schedule surgery for Saturday.  Again, we will update as soon as we have a definitive plan for that.  Please hold chemical DVT prophylaxis.  Recommend SCDs  bilateral. -Appreciate the medicine service for their admission and management.    Yolonda Kida, MD Cell [phone]    08/24/2017 5:35 PM

## 2017-08-25 DIAGNOSIS — R262 Difficulty in walking, not elsewhere classified: Secondary | ICD-10-CM

## 2017-08-25 DIAGNOSIS — I619 Nontraumatic intracerebral hemorrhage, unspecified: Secondary | ICD-10-CM

## 2017-08-25 MED ORDER — OXYCODONE HCL 5 MG PO TABS
5.0000 mg | ORAL_TABLET | ORAL | Status: DC | PRN
  Administered 2017-08-25 – 2017-08-30 (×22): 5 mg via ORAL
  Filled 2017-08-25 (×22): qty 1

## 2017-08-25 MED ORDER — OXYCODONE HCL 5 MG PO TABS
5.0000 mg | ORAL_TABLET | Freq: Once | ORAL | Status: AC
  Administered 2017-08-25: 5 mg via ORAL
  Filled 2017-08-25: qty 1

## 2017-08-25 NOTE — Progress Notes (Signed)
   Subjective: Recheck right hip fracture Plan for total hip on Monday Pain adequately controlled currently Denies any new symptoms or issues  Patient reports pain as mild.  Objective:   VITALS:   Vitals:   08/24/17 2315 08/25/17 0544  BP: (!) 161/96 (!) 144/87  Pulse:  69  Resp:  18  Temp:  98 F (36.7 C)  SpO2:  99%    Right lower extremity with minimal shortening and minimal external rotation nv intact distally bilaterally Not taken thru rom today due to known fracture  LABS Recent Labs    08/24/17 1351  HGB 12.9*  HCT 37.2*  WBC 11.5*  PLT 198    Recent Labs    08/24/17 1351  NA 137  K 3.4*  BUN 8  CREATININE 0.81  GLUCOSE 99     Assessment/Plan: Right femur fracture Plan for total hip by Dr. Linna CapriceSwinteck on Monday 4/8 Continue pain management NPO midnight early Monday morning Non weight bearing right lower extremity Will continue to monitor his progress   Alphonsa OverallBrad Dixon PA-C, MPAS [facility] now [facility]  [facility] [address], [place], [place] [address] Phone: [phone] [email or web address] Facebook  [employer]  LinkedIn  Twitter

## 2017-08-25 NOTE — Progress Notes (Signed)
PROGRESS NOTE  Austin BarrWilliam H Baty Jr. ZOX:[id number] DOB: 06-10-1955 DOA: 08/24/2017 PCP: Lonie Peakonroy, Nathan, PA-C  HPI/Recap of past 24 hours: Austin BarrWilliam H Sailer Jr. is a 62 y.o. male past medical history significant for hypertension and stroke presents the emergency room with right hip pain.  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 Patient had a mechanical fall at home.  Denies any head injury. Went to see PCP while on crutches. Was sent to ED for eval.  08/25/2017: Patient seen and examined at his bedside.  He denies any pain at the time of this examination.  Orthopedic surgery is following and planning for a total hip replacement on Monday, 08/27/2017.  Assessment/Plan: Active Problems:   Hip fracture (HCC)  Right hip fracture secondary to mechanical fall Orthopedic following and planning for right hip total 08/27/2017 replacement on Monday Continue pain management Started on OxyIR Started on Senokot twice daily and Dulcolax as needed Bowel regimen in place  Hypertension Blood pressures well controlled Continue HCTZ Continue gentle IV hydration  Hypokalemia Potassium 3.4 Repleted Repeat BMP in the morning  Ambulatory dysfunction due to right hip fracture PT to evaluate the post surgery Continue PT orthopedic surgery recommendations Fall precautions  History of hemorrhagic stroke Patient has limited use of his right leg and right arm Fall precautions   Code Status: Full Family Communication: None at bedside  Disposition Plan: SNF when clinically stable after his surgery   Consultants:  Orthopedic surgery  Procedures:  None  Antimicrobials:  None  DVT prophylaxis: SCDs  Objective: Vitals:   08/24/17 2315 08/25/17 0544 08/25/17 0835 08/25/17 1257  BP: (!) 161/96 (!) 144/87 (!) 143/88 131/83  Pulse:  69 (!) 55 62  Resp:  18 14 16   Temp:  98 F (36.7 C) 98 F (36.7 C) 98.3 F (36.8 C)  TempSrc:  Oral Oral Oral    SpO2:  99% 98% 98%  Weight:      Height:        Intake/Output Summary (Last 24 hours) at 08/25/2017 1511 Last data filed at 08/25/2017 1400 Gross per 24 hour  Intake 1711.67 ml  Output 1200 ml  Net 511.67 ml   Filed Weights   08/24/17 1408  Weight: 59 kg (130 lb)    Exam:   General: 62 year old African-American male thin build in no acute distress.  Very hard of hearing  Cardiovascular: Regular rate and rhythm with no rubs or gallops.  Respiratory: Clear to auscultation with no wheezes no rales  Abdomen: Soft nontender nondistended normal bowel sounds x4  Musculoskeletal: Right lower extremity is medially rotated  Skin: No noted destructive lesion  Psychiatry: Mood is appropriate for condition     Data Reviewed: CBC: Recent Labs  Lab 08/24/17 1351  WBC 11.5*  NEUTROABS 8.2*  HGB 12.9*  HCT 37.2*  MCV 92.8  PLT 198   Basic Metabolic Panel: Recent Labs  Lab 08/24/17 1351  NA 137  K 3.4*  CL 100*  CO2 25  GLUCOSE 99  BUN 8  CREATININE 0.81  CALCIUM 9.2   GFR: Estimated Creatinine Clearance: 77.9 mL/min (by C-G formula based on SCr of 0.81 mg/dL). Liver Function Tests: No results for input(s): AST, ALT, ALKPHOS, BILITOT, PROT, ALBUMIN in the last 168 hours. No results for input(s): LIPASE, AMYLASE in the last 168 hours. No results for input(s): AMMONIA in the last 168 hours. Coagulation Profile: Recent Labs  Lab 08/24/17 1620  INR 1.11  Cardiac Enzymes: No results for input(s): CKTOTAL, CKMB, CKMBINDEX, TROPONINI in the last 168 hours. BNP (last 3 results) No results for input(s): PROBNP in the last 8760 hours. HbA1C: No results for input(s): HGBA1C in the last 72 hours. CBG: No results for input(s): GLUCAP in the last 168 hours. Lipid Profile: No results for input(s): CHOL, HDL, LDLCALC, TRIG, CHOLHDL, LDLDIRECT in the last 72 hours. Thyroid Function Tests: No results for input(s): TSH, T4TOTAL, FREET4, T3FREE, THYROIDAB in the last 72  hours. Anemia Panel: No results for input(s): VITAMINB12, FOLATE, FERRITIN, TIBC, IRON, RETICCTPCT in the last 72 hours. Urine analysis: No results found for: COLORURINE, APPEARANCEUR, LABSPEC, PHURINE, GLUCOSEU, HGBUR, BILIRUBINUR, KETONESUR, PROTEINUR, UROBILINOGEN, NITRITE, LEUKOCYTESUR Sepsis Labs: @LABRCNTIP (procalcitonin:4,lacticidven:4)  )No results found for this or any previous visit (from the past 240 hour(s)).    Studies: Dg Pelvis Portable  Result Date: 08/24/2017 CLINICAL DATA:  Displaced right femoral neck fracture. EXAM: PORTABLE PELVIS 1-2 VIEWS COMPARISON:  08/24/2017 right hip and femur radiographs as well as CT of the right hip. FINDINGS: Patient is slightly oblique this AP portable supine view of the pelvis. Femoral heads are seated within their acetabular components. Redemonstration of known right femoral neck fracture involving the basicervical portion medially with impaction along the superolateral aspect of the femoral head-neck junction. The pubic rami, bony pelvis and left proximal femur are intact. Acetabular components are intact. The degree of minimal offset involving the base of the right femoral neck is less apparent on this study. IMPRESSION: Acute right femoral neck fracture along the basicervical portion medially with impaction of the femoral neck on femoral head superolaterally. No joint dislocation. The degree of offset involving the fracture at the base of the right femoral neck is less conspicuous on this study. Electronically Signed   By: Tollie Eth M.D.   On: 08/24/2017 18:57   Ct Hip Right Wo Contrast  Result Date: 08/24/2017 CLINICAL DATA:  62 year old male status post fall over dog yesterday presents with right hip pain. EXAM: CT OF THE RIGHT HIP WITHOUT CONTRAST TECHNIQUE: Multidetector CT imaging of the right hip was performed according to the standard protocol. Multiplanar CT image reconstructions were also generated. COMPARISON:  Radiographs from  08/24/2017 of the right hip. FINDINGS: Bones/Joint/Cartilage Acute, basicervical fracture of the right femur with impaction of the femoral neck on femoral head along the superolateral aspect also noted, series 6/39. There is 1.3 mm of lateral displacement of the main distal fracture fragment at the base of the femoral neck in addition. Pubic rami appear intact. Linear lucency with sclerotic margins compatible with a vascular channel is seen of the right superior pubic ramus, series 6/26. The femoral head is seated within its acetabular component. Degenerative acetabular spurring is noted off the of the acetabulum. No hip joint effusion. Intact greater and lesser trochanters. Small focus of fibrocystic change at the femoral head-neck juncture, series 7, image 32 which can be seen as sequela of femoroacetabular impingement. Ligaments Suboptimally assessed by CT. Muscles and Tendons No intramuscular hemorrhage or atrophy. Soft tissues Subcutaneous fatty induration along the lateral aspect of the right hip consistent with a contusion. Partially included right-sided hydrocele within the scrotum. IMPRESSION: 1. Acute comminuted fracture of the right femoral neck with minimally displaced basicervical fracture component as well as impaction of the femoral head upon the femoral head superolaterally. 2. No joint dislocation or joint effusion. 3. Partially included right sided hydrocele within the included scrotal sac. Electronically Signed   By: Rene Kocher.D.  On: 08/24/2017 18:00    Scheduled Meds: . hydrochlorothiazide  12.5 mg Oral Daily  . sodium chloride flush  3 mL Intravenous Q12H    Continuous Infusions:   LOS: 1 day     Darlin Drop, MD [facility] Pager [phone]  If 7PM-7AM, please contact night-coverage [email or web address] Password [email or web address] 08/25/2017, 3:11 PM

## 2017-08-26 LAB — HIV ANTIBODY (ROUTINE TESTING W REFLEX): HIV SCREEN 4TH GENERATION: NONREACTIVE

## 2017-08-26 MED ORDER — MAGNESIUM SULFATE 2 GM/50ML IV SOLN
2.0000 g | Freq: Once | INTRAVENOUS | Status: AC
  Administered 2017-08-26: 2 g via INTRAVENOUS
  Filled 2017-08-26: qty 50

## 2017-08-26 MED ORDER — POTASSIUM CHLORIDE CRYS ER 20 MEQ PO TBCR
40.0000 meq | EXTENDED_RELEASE_TABLET | Freq: Once | ORAL | Status: AC
  Administered 2017-08-26: 40 meq via ORAL
  Filled 2017-08-26: qty 2

## 2017-08-26 NOTE — Progress Notes (Addendum)
PROGRESS NOTE  Austin BarrWilliam H Alewine Jr. [id number] DOB: 03-01-1956 DOA: 08/24/2017 PCP: Lonie Peakonroy, Nathan, PA-C  HPI/Recap of past 24 hours: Austin BarrWilliam H Nusbaum Jr. is a 62 y.o. male past medical history significant for hypertension and stroke presents the emergency room with right hip pain.  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 Patient had a mechanical fall at home.  Denies any head injury. Went to see PCP while on crutches. Was sent to ED for eval.  08/25/2017: Patient seen and examined at his bedside.  He denies any pain at the time of this examination.  Orthopedic surgery is following and planning for a total hip replacement on Monday, 08/27/2017.  08/26/17: states pain is well controlled on current management when he does not move. Plan for surgery tomorrow.  Assessment/Plan: Active Problems:   Hip fracture (HCC)  Right hip fracture secondary to mechanical fall Orthopedic following and planning for right hip total 08/27/2017 replacement on Monday Continue pain management Started on OxyIR Started on Senokot twice daily and Dulcolax as needed Bowel regimen in place  Hypertension Blood pressures well controlled Continue HCTZ Continue gentle IV hydration  Hypokalemia Potassium 3.4 Repleted Repeat BMP in the morning  Ambulatory dysfunction due to right hip fracture PT to evaluate the post surgery Continue PT orthopedic surgery recommendations Fall precautions  History of hemorrhagic stroke Patient has limited use of his right leg and right arm Fall precautions   Code Status: Full Family Communication: None at bedside  Disposition Plan: SNF when clinically stable after his surgery   Consultants:  Orthopedic surgery  Procedures:  None  Antimicrobials:  None  DVT prophylaxis: SCDs  Objective: Vitals:   08/25/17 1257 08/25/17 2011 08/26/17 0441 08/26/17 1320  BP: 131/83 125/77 127/76 113/61  Pulse: 62 73 61 68  Resp:  16 16 16 18   Temp: 98.3 F (36.8 C) 98.3 F (36.8 C) 98.3 F (36.8 C) 97.8 F (36.6 C)  TempSrc: Oral Oral Oral Oral  SpO2: 98% 98% 96% 96%  Weight:      Height:        Intake/Output Summary (Last 24 hours) at 08/26/2017 1441 Last data filed at 08/26/2017 0900 Gross per 24 hour  Intake -  Output 500 ml  Net -500 ml   Filed Weights   08/24/17 1408  Weight: 59 kg (130 lb)    Exam: 08/26/17   General: very hard of hearing. 62 yo AAM frail NAD Cardiovascular: RRR no rubs or gallops. No JVD or thyromegaly.  Respiratory: Clear to auscultation with no wheezes no rales  Abdomen: Soft nontender nondistended normal bowel sounds x4  Musculoskeletal: Right lower extremity is medially rotated and shortened compared to left lower extremity.  Skin: No noted destructive lesion  Psychiatry: Mood is appropriate for condition     Data Reviewed: CBC: Recent Labs  Lab 08/24/17 1351  WBC 11.5*  NEUTROABS 8.2*  HGB 12.9*  HCT 37.2*  MCV 92.8  PLT 198   Basic Metabolic Panel: Recent Labs  Lab 08/24/17 1351  NA 137  K 3.4*  CL 100*  CO2 25  GLUCOSE 99  BUN 8  CREATININE 0.81  CALCIUM 9.2   GFR: Estimated Creatinine Clearance: 77.9 mL/min (by C-G formula based on SCr of 0.81 mg/dL). Liver Function Tests: No results for input(s): AST, ALT, ALKPHOS, BILITOT, PROT, ALBUMIN in the last 168 hours. No results for input(s): LIPASE, AMYLASE in the last 168 hours. No results for input(s): AMMONIA  in the last 168 hours. Coagulation Profile: Recent Labs  Lab 08/24/17 1620  INR 1.11   Cardiac Enzymes: No results for input(s): CKTOTAL, CKMB, CKMBINDEX, TROPONINI in the last 168 hours. BNP (last 3 results) No results for input(s): PROBNP in the last 8760 hours. HbA1C: No results for input(s): HGBA1C in the last 72 hours. CBG: No results for input(s): GLUCAP in the last 168 hours. Lipid Profile: No results for input(s): CHOL, HDL, LDLCALC, TRIG, CHOLHDL, LDLDIRECT in the last  72 hours. Thyroid Function Tests: No results for input(s): TSH, T4TOTAL, FREET4, T3FREE, THYROIDAB in the last 72 hours. Anemia Panel: No results for input(s): VITAMINB12, FOLATE, FERRITIN, TIBC, IRON, RETICCTPCT in the last 72 hours. Urine analysis: No results found for: COLORURINE, APPEARANCEUR, LABSPEC, PHURINE, GLUCOSEU, HGBUR, BILIRUBINUR, KETONESUR, PROTEINUR, UROBILINOGEN, NITRITE, LEUKOCYTESUR Sepsis Labs: @LABRCNTIP (procalcitonin:4,lacticidven:4)  )No results found for this or any previous visit (from the past 240 hour(s)).    Studies: No results found.  Scheduled Meds: . hydrochlorothiazide  12.5 mg Oral Daily  . sodium chloride flush  3 mL Intravenous Q12H    Continuous Infusions:   LOS: 2 days     Darlin Drop, MD [facility] Pager [phone]  If 7PM-7AM, please contact night-coverage [email or web address] Password [email or web address] 08/26/2017, 2:41 PM

## 2017-08-26 NOTE — Progress Notes (Signed)
   Subjective: Recheck right hip s/p fracture Plan for surgery tomorrow Denies any new symptoms overnight Pain is mild to moderate at times Patient reports pain as mild.  Objective:   VITALS:   Vitals:   08/25/17 2011 08/26/17 0441  BP: 125/77 127/76  Pulse: 73 61  Resp: 16 16  Temp: 98.3 F (36.8 C) 98.3 F (36.8 C)  SpO2: 98% 96%    Right lower extremity with slight shortening and external rotation nv intact distally Not taken thru a lot of rom due to known fracture  LABS Recent Labs    08/24/17 1351  HGB 12.9*  HCT 37.2*  WBC 11.5*  PLT 198    Recent Labs    08/24/17 1351  NA 137  K 3.4*  BUN 8  CREATININE 0.81  GLUCOSE 99     Assessment/Plan: NPO after midnight Plan for surgery tomorrow Pt in agreement Continue pain management as needed    Elizebeth KollerBrad Dixon PA-C, MPAS [facility]  [facility] [address], [place], [place] [address] Phone: [phone] [email or web address] Facebook  [employer]  LinkedIn  Twitter

## 2017-08-27 ENCOUNTER — Inpatient Hospital Stay (HOSPITAL_COMMUNITY): Payer: Medicare Other | Admitting: Certified Registered Nurse Anesthetist

## 2017-08-27 ENCOUNTER — Encounter (HOSPITAL_COMMUNITY): Payer: Self-pay | Admitting: Orthopedic Surgery

## 2017-08-27 ENCOUNTER — Encounter (HOSPITAL_COMMUNITY)
Admission: EM | Disposition: A | Discharge status: Home Health | Payer: Self-pay | Source: Home / Self Care | Attending: Internal Medicine

## 2017-08-27 ENCOUNTER — Inpatient Hospital Stay (HOSPITAL_COMMUNITY): Payer: Medicare Other

## 2017-08-27 DIAGNOSIS — Z419 Encounter for procedure for purposes other than remedying health state, unspecified: Secondary | ICD-10-CM

## 2017-08-27 DIAGNOSIS — S72001A Fracture of unspecified part of neck of right femur, initial encounter for closed fracture: Secondary | ICD-10-CM | POA: Diagnosis present

## 2017-08-27 HISTORY — PX: TOTAL HIP ARTHROPLASTY: SHX124

## 2017-08-27 LAB — CBC
HEMATOCRIT: 35 % — AB (ref 39.0–52.0)
HEMOGLOBIN: 12.4 g/dL — AB (ref 13.0–17.0)
MCH: 32.7 pg (ref 26.0–34.0)
MCHC: 35.4 g/dL (ref 30.0–36.0)
MCV: 92.3 fL (ref 78.0–100.0)
Platelets: 204 10*3/uL (ref 150–400)
RBC: 3.79 MIL/uL — ABNORMAL LOW (ref 4.22–5.81)
RDW: 12.1 % (ref 11.5–15.5)
WBC: 7.3 10*3/uL (ref 4.0–10.5)

## 2017-08-27 LAB — COMPREHENSIVE METABOLIC PANEL
ALBUMIN: 3.3 g/dL — AB (ref 3.5–5.0)
ALT: 21 U/L (ref 17–63)
ANION GAP: 11 (ref 5–15)
AST: 27 U/L (ref 15–41)
Alkaline Phosphatase: 53 U/L (ref 38–126)
BILIRUBIN TOTAL: 1.1 mg/dL (ref 0.3–1.2)
BUN: 9 mg/dL (ref 6–20)
CHLORIDE: 100 mmol/L — AB (ref 101–111)
CO2: 24 mmol/L (ref 22–32)
Calcium: 8.8 mg/dL — ABNORMAL LOW (ref 8.9–10.3)
Creatinine, Ser: 0.98 mg/dL (ref 0.61–1.24)
GFR calc Af Amer: >60 mL/min (ref >60)
GFR calc non Af Amer: >60 mL/min (ref >60)
GLUCOSE: 96 mg/dL (ref 65–99)
POTASSIUM: 4 mmol/L (ref 3.5–5.1)
SODIUM: 135 mmol/L (ref 135–145)
TOTAL PROTEIN: 6.8 g/dL (ref 6.5–8.1)

## 2017-08-27 LAB — MAGNESIUM: Magnesium: 2.3 mg/dL (ref 1.7–2.4)

## 2017-08-27 LAB — MRSA PCR SCREENING: MRSA by PCR: NEGATIVE

## 2017-08-27 SURGERY — ARTHROPLASTY, HIP, TOTAL, ANTERIOR APPROACH
Anesthesia: General | Laterality: Right

## 2017-08-27 MED ORDER — KETOROLAC TROMETHAMINE 30 MG/ML IJ SOLN
INTRAMUSCULAR | Status: DC | PRN
  Administered 2017-08-27: 30 mg via INTRAVENOUS

## 2017-08-27 MED ORDER — BUPIVACAINE-EPINEPHRINE (PF) 0.5% -1:200000 IJ SOLN
INTRAMUSCULAR | Status: AC
  Filled 2017-08-27: qty 30

## 2017-08-27 MED ORDER — ENOXAPARIN SODIUM 40 MG/0.4ML ~~LOC~~ SOLN
40.0000 mg | SUBCUTANEOUS | Status: DC
  Administered 2017-08-28 – 2017-08-30 (×3): 40 mg via SUBCUTANEOUS
  Filled 2017-08-27 (×3): qty 0.4

## 2017-08-27 MED ORDER — MENTHOL 3 MG MT LOZG: 1.0000 | LOZENGE | OROMUCOSAL | Status: DC | PRN

## 2017-08-27 MED ORDER — SUGAMMADEX SODIUM 200 MG/2ML IV SOLN
INTRAVENOUS | Status: AC
  Filled 2017-08-27: qty 2

## 2017-08-27 MED ORDER — TRANEXAMIC ACID 1000 MG/10ML IV SOLN
1000.0000 mg | INTRAVENOUS | Status: AC
  Administered 2017-08-27: 1000 mg via INTRAVENOUS
  Filled 2017-08-27: qty 10

## 2017-08-27 MED ORDER — PROPOFOL 10 MG/ML IV BOLUS
INTRAVENOUS | Status: AC
  Filled 2017-08-27: qty 40

## 2017-08-27 MED ORDER — HYDROMORPHONE HCL 1 MG/ML IJ SOLN
0.2500 mg | INTRAMUSCULAR | Status: DC | PRN
  Administered 2017-08-27 (×4): 0.5 mg via INTRAVENOUS

## 2017-08-27 MED ORDER — SUGAMMADEX SODIUM 500 MG/5ML IV SOLN
INTRAVENOUS | Status: DC | PRN
  Administered 2017-08-27: 200 mg via INTRAVENOUS

## 2017-08-27 MED ORDER — KETOROLAC TROMETHAMINE 30 MG/ML IJ SOLN
INTRAMUSCULAR | Status: AC
  Filled 2017-08-27: qty 1

## 2017-08-27 MED ORDER — HYDROMORPHONE HCL 1 MG/ML IJ SOLN
INTRAMUSCULAR | Status: AC
  Administered 2017-08-27: 16:00:00
  Filled 2017-08-27: qty 1

## 2017-08-27 MED ORDER — LIDOCAINE HCL (CARDIAC) 20 MG/ML IV SOLN
INTRAVENOUS | Status: DC | PRN
  Administered 2017-08-27: 30 mg via INTRAVENOUS

## 2017-08-27 MED ORDER — PROMETHAZINE HCL 25 MG/ML IJ SOLN: 6.2500 mg | INTRAMUSCULAR | Status: DC | PRN

## 2017-08-27 MED ORDER — DEXAMETHASONE SODIUM PHOSPHATE 10 MG/ML IJ SOLN
INTRAMUSCULAR | Status: DC | PRN
  Administered 2017-08-27: 10 mg via INTRAVENOUS

## 2017-08-27 MED ORDER — ROCURONIUM BROMIDE 100 MG/10ML IV SOLN
INTRAVENOUS | Status: DC | PRN
  Administered 2017-08-27: 50 mg via INTRAVENOUS
  Administered 2017-08-27: 20 mg via INTRAVENOUS

## 2017-08-27 MED ORDER — METOCLOPRAMIDE HCL 5 MG/ML IJ SOLN: 5.0000 mg | Freq: Three times a day (TID) | INTRAMUSCULAR | Status: DC | PRN

## 2017-08-27 MED ORDER — BUPIVACAINE-EPINEPHRINE (PF) 0.5% -1:200000 IJ SOLN
INTRAMUSCULAR | Status: DC | PRN
  Administered 2017-08-27: 30 mL via PERINEURAL

## 2017-08-27 MED ORDER — SODIUM CHLORIDE 0.9 % IV SOLN
INTRAVENOUS | Status: AC | PRN
  Administered 2017-08-27: 1000 mL via INTRAMUSCULAR

## 2017-08-27 MED ORDER — SODIUM CHLORIDE 0.9 % IR SOLN
Status: DC | PRN
  Administered 2017-08-27: 3000 mL

## 2017-08-27 MED ORDER — PHENOL 1.4 % MT LIQD: 1.0000 | OROMUCOSAL | Status: DC | PRN

## 2017-08-27 MED ORDER — CHLORHEXIDINE GLUCONATE 4 % EX LIQD
60.0000 mL | Freq: Once | CUTANEOUS | Status: AC
  Administered 2017-08-27: 4 via TOPICAL

## 2017-08-27 MED ORDER — CEFAZOLIN SODIUM-DEXTROSE 2-4 GM/100ML-% IV SOLN
2.0000 g | INTRAVENOUS | Status: AC
  Administered 2017-08-27: 2 g via INTRAVENOUS
  Filled 2017-08-27 (×2): qty 100

## 2017-08-27 MED ORDER — HYDRALAZINE HCL 20 MG/ML IJ SOLN
INTRAMUSCULAR | Status: AC
  Administered 2017-08-27: 16:00:00
  Filled 2017-08-27: qty 1

## 2017-08-27 MED ORDER — CEFAZOLIN SODIUM-DEXTROSE 2-4 GM/100ML-% IV SOLN
2.0000 g | Freq: Four times a day (QID) | INTRAVENOUS | Status: AC
  Administered 2017-08-27 – 2017-08-28 (×2): 2 g via INTRAVENOUS
  Filled 2017-08-27 (×2): qty 100

## 2017-08-27 MED ORDER — LACTATED RINGERS IV SOLN
INTRAVENOUS | Status: DC
  Administered 2017-08-27 (×2): via INTRAVENOUS

## 2017-08-27 MED ORDER — MIDAZOLAM HCL 2 MG/2ML IJ SOLN
INTRAMUSCULAR | Status: AC
  Filled 2017-08-27: qty 2

## 2017-08-27 MED ORDER — DOCUSATE SODIUM 100 MG PO CAPS
100.0000 mg | ORAL_CAPSULE | Freq: Two times a day (BID) | ORAL | Status: DC
  Administered 2017-08-27 – 2017-08-30 (×6): 100 mg via ORAL
  Filled 2017-08-27 (×6): qty 1

## 2017-08-27 MED ORDER — FENTANYL CITRATE (PF) 250 MCG/5ML IJ SOLN
INTRAMUSCULAR | Status: AC
  Filled 2017-08-27: qty 5

## 2017-08-27 MED ORDER — METOCLOPRAMIDE HCL 5 MG PO TABS: 5.0000 mg | ORAL_TABLET | Freq: Three times a day (TID) | ORAL | Status: DC | PRN

## 2017-08-27 MED ORDER — FENTANYL CITRATE (PF) 100 MCG/2ML IJ SOLN
INTRAMUSCULAR | Status: DC | PRN
  Administered 2017-08-27 (×4): 50 ug via INTRAVENOUS

## 2017-08-27 MED ORDER — ONDANSETRON HCL 4 MG/2ML IJ SOLN
INTRAMUSCULAR | Status: AC
  Filled 2017-08-27: qty 2

## 2017-08-27 MED ORDER — SODIUM CHLORIDE 0.9 % IJ SOLN
INTRAMUSCULAR | Status: DC | PRN
  Administered 2017-08-27: 30 mL

## 2017-08-27 MED ORDER — POVIDONE-IODINE 10 % EX SWAB
2.0000 "application " | Freq: Once | CUTANEOUS | Status: AC
  Administered 2017-08-27: 2 via TOPICAL

## 2017-08-27 MED ORDER — 0.9 % SODIUM CHLORIDE (POUR BTL) OPTIME
TOPICAL | Status: DC | PRN
  Administered 2017-08-27: 1000 mL

## 2017-08-27 MED ORDER — PROPOFOL 10 MG/ML IV BOLUS
INTRAVENOUS | Status: DC | PRN
  Administered 2017-08-27: 80 mg via INTRAVENOUS

## 2017-08-27 MED ORDER — DEXAMETHASONE SODIUM PHOSPHATE 10 MG/ML IJ SOLN
INTRAMUSCULAR | Status: AC
  Filled 2017-08-27: qty 1

## 2017-08-27 MED ORDER — MIDAZOLAM HCL 5 MG/5ML IJ SOLN
INTRAMUSCULAR | Status: DC | PRN
  Administered 2017-08-27: 1 mg via INTRAVENOUS

## 2017-08-27 MED ORDER — ROCURONIUM BROMIDE 10 MG/ML (PF) SYRINGE
PREFILLED_SYRINGE | INTRAVENOUS | Status: AC
  Filled 2017-08-27: qty 5

## 2017-08-27 SURGICAL SUPPLY — 54 items
ADH SKN CLS APL DERMABOND .7 (GAUZE/BANDAGES/DRESSINGS) ×1
ALCOHOL ISOPROPYL (RUBBING) (MISCELLANEOUS) ×3 IMPLANT
BLADE CLIPPER SURG (BLADE) IMPLANT
CAPT HIP TOTAL 2 ×2 IMPLANT
CHLORAPREP W/TINT 26ML (MISCELLANEOUS) ×3 IMPLANT
COVER SURGICAL LIGHT HANDLE (MISCELLANEOUS) ×3 IMPLANT
DERMABOND ADVANCED (GAUZE/BANDAGES/DRESSINGS) ×2
DERMABOND ADVANCED .7 DNX12 (GAUZE/BANDAGES/DRESSINGS) ×2 IMPLANT
DRAPE C-ARM 42X72 X-RAY (DRAPES) ×3 IMPLANT
DRAPE STERI IOBAN 125X83 (DRAPES) ×3 IMPLANT
DRAPE U-SHAPE 47X51 STRL (DRAPES) ×9 IMPLANT
DRSG AQUACEL AG ADV 3.5X10 (GAUZE/BANDAGES/DRESSINGS) ×3 IMPLANT
ELECT BLADE 4.0 EZ CLEAN MEGAD (MISCELLANEOUS) ×3
ELECT PENCIL ROCKER SW 15FT (MISCELLANEOUS) ×3 IMPLANT
ELECT REM PT RETURN 9FT ADLT (ELECTROSURGICAL) ×3
ELECTRODE BLDE 4.0 EZ CLN MEGD (MISCELLANEOUS) ×1 IMPLANT
ELECTRODE REM PT RTRN 9FT ADLT (ELECTROSURGICAL) ×1 IMPLANT
EVACUATOR 1/8 PVC DRAIN (DRAIN) IMPLANT
GLOVE BIO SURGEON STRL SZ8.5 (GLOVE) ×8 IMPLANT
GLOVE BIOGEL PI IND STRL 8.5 (GLOVE) ×1 IMPLANT
GLOVE BIOGEL PI INDICATOR 8.5 (GLOVE) ×2
GOWN STRL REUS W/ TWL LRG LVL3 (GOWN DISPOSABLE) ×2 IMPLANT
GOWN STRL REUS W/TWL 2XL LVL3 (GOWN DISPOSABLE) ×3 IMPLANT
GOWN STRL REUS W/TWL LRG LVL3 (GOWN DISPOSABLE) ×6
HANDPIECE INTERPULSE COAX TIP (DISPOSABLE) ×3
HOOD PEEL AWAY FACE SHEILD DIS (HOOD) ×6 IMPLANT
KIT BASIN OR (CUSTOM PROCEDURE TRAY) ×3 IMPLANT
KIT TURNOVER KIT B (KITS) ×3 IMPLANT
MANIFOLD NEPTUNE II (INSTRUMENTS) ×3 IMPLANT
MARKER SKIN DUAL TIP RULER LAB (MISCELLANEOUS) ×6 IMPLANT
NDL SPNL 18GX3.5 QUINCKE PK (NEEDLE) ×1 IMPLANT
NEEDLE SPNL 18GX3.5 QUINCKE PK (NEEDLE) ×3 IMPLANT
NS IRRIG 1000ML POUR BTL (IV SOLUTION) ×3 IMPLANT
PACK TOTAL JOINT (CUSTOM PROCEDURE TRAY) ×3 IMPLANT
PACK UNIVERSAL I (CUSTOM PROCEDURE TRAY) ×3 IMPLANT
PAD ARMBOARD 7.5X6 YLW CONV (MISCELLANEOUS) ×6 IMPLANT
SAW OSC TIP CART 19.5X105X1.3 (SAW) ×3 IMPLANT
SEALER BIPOLAR AQUA 6.0 (INSTRUMENTS) IMPLANT
SET HNDPC FAN SPRY TIP SCT (DISPOSABLE) ×1 IMPLANT
SOL PREP POV-IOD 4OZ 10% (MISCELLANEOUS) ×3 IMPLANT
SUT ETHIBOND NAB CT1 #1 30IN (SUTURE) ×6 IMPLANT
SUT MNCRL AB 3-0 PS2 18 (SUTURE) ×3 IMPLANT
SUT MON AB 2-0 CT1 36 (SUTURE) ×3 IMPLANT
SUT VIC AB 1 CT1 27 (SUTURE) ×3
SUT VIC AB 1 CT1 27XBRD ANBCTR (SUTURE) ×1 IMPLANT
SUT VIC AB 2-0 CT1 27 (SUTURE) ×3
SUT VIC AB 2-0 CT1 TAPERPNT 27 (SUTURE) ×1 IMPLANT
SUT VLOC 180 0 24IN GS25 (SUTURE) ×3 IMPLANT
SYR 50ML LL SCALE MARK (SYRINGE) ×3 IMPLANT
TOWEL OR 17X24 6PK STRL BLUE (TOWEL DISPOSABLE) ×3 IMPLANT
TOWEL OR 17X26 10 PK STRL BLUE (TOWEL DISPOSABLE) ×3 IMPLANT
TRAY CATH 16FR W/PLASTIC CATH (SET/KITS/TRAYS/PACK) IMPLANT
TRAY FOLEY CATH SILVER 16FR (SET/KITS/TRAYS/PACK) IMPLANT
WATER STERILE IRR 1000ML POUR (IV SOLUTION) ×9 IMPLANT

## 2017-08-27 NOTE — Anesthesia Preprocedure Evaluation (Addendum)
Anesthesia Evaluation  Patient identified by MRN, date of birth, ID band Patient awake    Reviewed: Allergy & Precautions, H&P , Patient's Chart, lab work & pertinent test results, reviewed documented beta blocker date and time   Airway Mallampati: II  TM Distance: >3 FB Neck ROM: full    Dental no notable dental hx.    Pulmonary Current Smoker,    Pulmonary exam normal breath sounds clear to auscultation       Cardiovascular hypertension,  Rhythm:regular Rate:Normal     Neuro/Psych CVA    GI/Hepatic   Endo/Other    Renal/GU      Musculoskeletal   Abdominal   Peds  Hematology   Anesthesia Other Findings   Reproductive/Obstetrics                            Anesthesia Physical Anesthesia Plan  ASA: III  Anesthesia Plan: General   Post-op Pain Management:    Induction: Intravenous  PONV Risk Score and Plan: 1 and Treatment may vary due to age or medical condition, Ondansetron and Dexamethasone  Airway Management Planned: Oral ETT  Additional Equipment:   Intra-op Plan:   Post-operative Plan: Extubation in OR  Informed Consent: I have reviewed the patients History and Physical, chart, labs and discussed the procedure including the risks, benefits and alternatives for the proposed anesthesia with the patient or authorized representative who has indicated his/her understanding and acceptance.   Dental Advisory Given  Plan Discussed with: CRNA and Surgeon  Anesthesia Plan Comments: (  )        Anesthesia Quick Evaluation

## 2017-08-27 NOTE — Progress Notes (Signed)
PROGRESS NOTE  Austin Barr. JWJ:[id number] DOB: [birth year] DOA: 08/24/2017 PCP: Lonie Peak, PA-C  HPI/Recap of past 24 hours: Austin Barr. is a 62 y.o. male past medical history significant for hypertension and stroke presents the emergency room with right hip pain.  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 Patient had a mechanical fall at home.  Denies any head injury. Went to see PCP while on crutches. Was sent to ED for eval.  08/25/2017: Patient seen and examined at his bedside.  He denies any pain at the time of this examination.  Orthopedic surgery is following and planning for a total hip replacement on Monday, 08/27/2017.  08/26/17: states pain is well controlled on current management when he does not move. Plan for surgery tomorrow.  08/27/17: No new complaints. Planned surgery today. POD # 0 post right total hip athroplasty.  Assessment/Plan: Active Problems:   Hip fracture (HCC)   Displaced fracture of right femoral neck (HCC)  Right hip fracture secondary to mechanical fall POD #0 post right total hip athroplasty Orthopedic following  Continue pain management Started on OxyIR continue Senokot twice daily and Dulcolax as needed Bowel regimen in place  Hypertension Blood pressures well controlled Continue HCTZ Continue gentle IV hydration  Hypokalemia Potassium 3.4 Repleted Repeat BMP in the morning  Ambulatory dysfunction due to right hip fracture PT to evaluate the post surgery Continue PT orthopedic surgery recommendations Fall precautions  History of hemorrhagic stroke Patient has limited use of his right leg and right arm Fall precautions   Code Status: Full Family Communication: None at bedside  Disposition Plan: SNF when clinically stable after his surgery   Consultants:  Orthopedic surgery  Procedures:  None  Antimicrobials:  None  DVT prophylaxis: SCDs  Objective: Vitals:   08/27/17 1555 08/27/17 1601 08/27/17 1606 08/27/17 1610  BP: (!) 184/95 (!) 166/99 (!) 144/82 (!) 131/91  Pulse:  85 93 91  Resp: 17 18 19 17   Temp:    98.4 F (36.9 C)  TempSrc:      SpO2: 95% 95% 96% 93%  Weight:      Height:        Intake/Output Summary (Last 24 hours) at 08/27/2017 1635 Last data filed at 08/27/2017 1610 Gross per 24 hour  Intake 1550 ml  Output 750 ml  Net 800 ml   Filed Weights   08/24/17 1408 08/27/17 1109  Weight: 59 kg (130 lb) 59 kg (130 lb)    Exam: 08/27/17  General: very hard of hearing. NAD 62 yo AAM. Alert and interactive.   Cardiovascular: RRR no rubs or gallops. No JVD or thyromegaly  Respiratory: Clear to auscultation with no wheezes no rales  Abdomen: Soft nontender nondistended normal bowel sounds x4  Musculoskeletal: Right lower extremity is medially rotated and shortened compared to left lower extremity.  Skin: No noted destructive lesion  Psychiatry: Mood is appropriate for condition     Data Reviewed: CBC: Recent Labs  Lab 08/24/17 1351 08/27/17 0530  WBC 11.5* 7.3  NEUTROABS 8.2*  --   HGB 12.9* 12.4*  HCT 37.2* 35.0*  MCV 92.8 92.3  PLT 198 204   Basic Metabolic Panel: Recent Labs  Lab 08/24/17 1351 08/27/17 0530  NA 137 135  K 3.4* 4.0  CL 100* 100*  CO2 25 24  GLUCOSE 99 96  BUN 8 9  CREATININE 0.81 0.98  CALCIUM 9.2 8.8*  MG  --  2.3   GFR: Estimated Creatinine Clearance: 64.4 mL/min (by C-G formula based on SCr of 0.98 mg/dL). Liver Function Tests: Recent Labs  Lab 08/27/17 0530  AST 27  ALT 21  ALKPHOS 53  BILITOT 1.1  PROT 6.8  ALBUMIN 3.3*   No results for input(s): LIPASE, AMYLASE in the last 168 hours. No results for input(s): AMMONIA in the last 168 hours. Coagulation Profile: Recent Labs  Lab 08/24/17 1620  INR 1.11   Cardiac Enzymes: No results for input(s): CKTOTAL, CKMB, CKMBINDEX, TROPONINI in the last 168 hours. BNP (last 3 results) No results for input(s): PROBNP in the  last 8760 hours. HbA1C: No results for input(s): HGBA1C in the last 72 hours. CBG: No results for input(s): GLUCAP in the last 168 hours. Lipid Profile: No results for input(s): CHOL, HDL, LDLCALC, TRIG, CHOLHDL, LDLDIRECT in the last 72 hours. Thyroid Function Tests: No results for input(s): TSH, T4TOTAL, FREET4, T3FREE, THYROIDAB in the last 72 hours. Anemia Panel: No results for input(s): VITAMINB12, FOLATE, FERRITIN, TIBC, IRON, RETICCTPCT in the last 72 hours. Urine analysis: No results found for: COLORURINE, APPEARANCEUR, LABSPEC, PHURINE, GLUCOSEU, HGBUR, BILIRUBINUR, KETONESUR, PROTEINUR, UROBILINOGEN, NITRITE, LEUKOCYTESUR Sepsis Labs: @LABRCNTIP (procalcitonin:4,lacticidven:4)  ) Recent Results (from the past 240 hour(s))  MRSA PCR Screening     Status: None   Collection Time: 08/27/17  7:53 AM  Result Value Ref Range Status   MRSA by PCR NEGATIVE NEGATIVE Final    Comment:        The GeneXpert MRSA Assay (FDA approved for NASAL specimens only), is one component of a comprehensive MRSA colonization surveillance program. It is not intended to diagnose MRSA infection nor to guide or monitor treatment for MRSA infections. Performed at [facility], [address] [place], [place] [address]       Studies: Pelvis Portable  Result Date: 08/27/2017 CLINICAL DATA:  Status post right hip replacement today. EXAM: PORTABLE PELVIS 1-2 VIEWS COMPARISON:  CT right hip 08/24/2017. FINDINGS: New right total hip arthroplasty is in place. The device is located. No fracture. Left hip is unremarkable. Gas in the soft tissues related to surgery noted. IMPRESSION: Status post right total hip replacement.  No acute finding. Electronically Signed   By: Drusilla Kannerhomas  Dalessio M.D.   On: 08/27/2017 15:56    Scheduled Meds: . docusate sodium  100 mg Oral BID  . [START ON 08/28/2017] enoxaparin (LOVENOX) injection  40 mg Subcutaneous Q24H  . hydrochlorothiazide  12.5 mg Oral Daily  . sodium  chloride flush  3 mL Intravenous Q12H    Continuous Infusions: .  ceFAZolin (ANCEF) IV    . lactated ringers Stopped (08/27/17 1537)     LOS: 3 days     Darlin Droparole N Harli Engelken, MD [facility] Pager [phone]  If 7PM-7AM, please contact night-coverage [email or web address] Password [email or web address] 08/27/2017, 4:35 PM

## 2017-08-27 NOTE — Anesthesia Procedure Notes (Signed)
Procedure Name: Intubation Date/Time: 08/27/2017 1:59 PM Performed by: Lyndle Herrlich, MD Pre-anesthesia Checklist: Emergency Drugs available, Patient identified, Suction available, Patient being monitored and Timeout performed Patient Re-evaluated:Patient Re-evaluated prior to induction Oxygen Delivery Method: Circle system utilized Preoxygenation: Pre-oxygenation with 100% oxygen Induction Type: IV induction Ventilation: Mask ventilation without difficulty Laryngoscope Size: Mac and 4 Grade View: Grade I Tube type: Oral Tube size: 7.5 mm Number of attempts: 1 Airway Equipment and Method: Stylet Secured at: 21 cm Tube secured with: Tape Dental Injury: Teeth and Oropharynx as per pre-operative assessment

## 2017-08-27 NOTE — Anesthesia Postprocedure Evaluation (Signed)
Anesthesia Post Note  Patient: Hoyle BarrWilliam H Pointer Jr.  Procedure(s) Performed: TOTAL HIP ARTHROPLASTY ANTERIOR APPROACH (Right )     Patient location during evaluation: PACU Anesthesia Type: General Level of consciousness: awake and alert Pain management: pain level controlled Vital Signs Assessment: post-procedure vital signs reviewed and stable Respiratory status: spontaneous breathing, nonlabored ventilation, respiratory function stable and patient connected to nasal cannula oxygen Cardiovascular status: blood pressure returned to baseline and stable Postop Assessment: no apparent nausea or vomiting Anesthetic complications: no    Last Vitals:  Vitals:   08/27/17 1610 08/27/17 2121  BP: (!) 131/91 124/90  Pulse: 91 81  Resp: 17 19  Temp: 36.9 C 36.6 C  SpO2: 93% 99%    Last Pain:  Vitals:   08/27/17 2121  TempSrc: Oral  PainSc: 5                  Ryan P Ellender

## 2017-08-27 NOTE — Transfer of Care (Signed)
Immediate Anesthesia Transfer of Care Note  Patient: Austin BarrWilliam H Markiewicz Jr.  Procedure(s) Performed: TOTAL HIP ARTHROPLASTY ANTERIOR APPROACH (Right )  Patient Location: PACU  Anesthesia Type:General  Level of Consciousness: awake and alert   Airway & Oxygen Therapy: Patient Spontanous Breathing and Patient connected to face mask oxygen  Post-op Assessment: Report given to RN and Post -op Vital signs reviewed and stable  Post vital signs: Reviewed and stable  Last Vitals:  Vitals Value Taken Time  BP 155/103 08/27/2017  3:36 PM  Temp    Pulse 114 08/27/2017  3:37 PM  Resp 28 08/27/2017  3:37 PM  SpO2 84 % 08/27/2017  3:37 PM  Vitals shown include unvalidated device data.  Last Pain:  Vitals:   08/27/17 1040  TempSrc:   PainSc: 6          Complications: No apparent anesthesia complications

## 2017-08-27 NOTE — Op Note (Signed)
OPERATIVE REPORT  SURGEON: Samson Frederic, MD   ASSISTANT: April Green, RNFA.  PREOPERATIVE DIAGNOSIS: Displaced Right femoral neck fracture.   POSTOPERATIVE DIAGNOSIS: Displaced Right femoral neck fracture.  PROCEDURE: Right total hip arthroplasty, anterior approach.   IMPLANTS: DePuy Tri Lock stem, size 6, hi offset. DePuy Pinnacle Cup, size 54 mm. DePuy Altrx liner, size 32 by 54 mm, +4 neutral. DePuy Biolox ceramic head ball, size 32 + 1 mm.  ANESTHESIA:  General  ESTIMATED BLOOD LOSS:-250 mL    ANTIBIOTICS: 2 g Ancef.  DRAINS: None.  COMPLICATIONS: None.   CONDITION: PACU - hemodynamically stable.   BRIEF CLINICAL NOTE: Kenichi Cassada. is a 62 y.o. male with a comminuted, displaced Right femoral neck fracture. The patient was indicated for total hip arthroplasty. The risks, benefits, and alternatives to the procedure were explained, and the patient elected to proceed.  PROCEDURE IN DETAIL: Surgical site was marked by myself in the pre-op holding area. Once inside the operating room, spinal anesthesia was obtained, and a foley catheter was inserted. The patient was then positioned on the Hana table. All bony prominences were well padded. The hip was prepped and draped in the normal sterile surgical fashion. A time-out was called verifying side and site of surgery. The patient received IV antibiotics within 60 minutes of beginning the procedure.  The direct anterior approach to the hip was performed through the Hueter interval. Lateral femoral circumflex vessels were treated with the Auqumantys. The anterior capsule was exposed and an inverted T capsulotomy was made. The fracture hematoma was encountered and evacuated.  The femoral neck fracture was identified.  Patient had a vertically oriented, comminuted femoral neck fracture.  The femoral neck cut was made to the level of the templated cut. A corkscrew was placed into the head and the head was removed.  The head was passed to the back table and was measured.  Acetabular exposure was achieved, and the pulvinar and labrum were excised. Sequential reaming of the acetabulum was then performed up to a size 53 mm reamer. A 54 mm cup was then opened and impacted into place at approximately 40 degrees of abduction and 20 degrees of anteversion. The final polyethylene liner was impacted into place and acetabular osteophytes were removed.   I then gained femoral exposure taking care to protect the abductors and greater trochanter. This was performed using standard external rotation, extension, and adduction. The capsule was peeled off the inner aspect of the greater trochanter, taking care to preserve the short external rotators. A cookie cutter was used to enter the femoral canal, and then the femoral canal finder was placed. Sequential broaching was performed up to a size 6. Calcar planer was used on the femoral neck remnant. I placed a hi offset neck and a trial head ball. The hip was reduced. Leg lengths and offset were checked fluoroscopically. The hip was dislocated and trial components were removed. The final implants were placed, and the hip was reduced.  Fluoroscopy was used to confirm component position and leg lengths. At 90 degrees of external rotation and full extension, the hip was stable to an anterior directed force.  The wound was copiously irrigated with normal saline using pulse lavage. Marcaine solution was injected into the periarticular soft tissue. The wound was closed in layers using #1 Vicryl and V-Loc for the fascia, 2-0 Vicryl for the subcutaneous fat, 2-0 Monocryl for the deep dermal layer, 3-0 running Monocryl subcuticular stitch, and Dermabond for the skin. Once the  glue was fully dried, an Aquacell Ag dressing was applied. The patient was transported to the recovery room in stable condition. Sponge, needle, and instrument counts were correct at the end of the case x2.  The patient tolerated the procedure well and there were no known complications.

## 2017-08-27 NOTE — Interval H&P Note (Signed)
History and Physical Interval Note:  08/27/2017 12:44 PM  Austin BarrWilliam H Baston Jr.  has presented today for surgery, with the diagnosis of RIGHT HIP FRACTURE  The various methods of treatment have been discussed with the patient and family. After consideration of risks, benefits and other options for treatment, the patient has consented to  Procedure(s): TOTAL HIP ARTHROPLASTY ANTERIOR APPROACH (Right) as a surgical intervention .  The patient's history has been reviewed, patient examined, no change in status, stable for surgery.  I have reviewed the patient's chart and labs.  Questions were answered to the patient's satisfaction.    The risks, benefits, and alternatives were discussed with the patient / mother. There are risks associated with the surgery including, but not limited to, problems with anesthesia (death), infection, instability (giving out of the joint), dislocation, differences in leg length/angulation/rotation, fracture of bones, loosening or failure of implants, hematoma (blood accumulation) which may require surgical drainage, blood clots, pulmonary embolism, nerve injury (foot drop and lateral thigh numbness), and blood vessel injury. The patient understands these risks and elects to proceed.   Iline OvenBrian J Jamerson Vonbargen

## 2017-08-27 NOTE — Discharge Instructions (Signed)
°Dr. Nahomi Hegner °Joint Replacement Specialist °[facility] °[address] °[place], [place] [address] °[phone] ° ° °TOTAL HIP REPLACEMENT POSTOPERATIVE DIRECTIONS ° ° ° °Hip Rehabilitation, Guidelines Following Surgery  ° °WEIGHT BEARING °Weight bearing as tolerated with assist device (walker, cane, etc) as directed, use it as long as suggested by your surgeon or therapist, typically at least 4-6 weeks. ° °The results of a hip operation are greatly improved after range of motion and muscle strengthening exercises. Follow all safety measures which are given to protect your hip. If any of these exercises cause increased pain or swelling in your joint, decrease the amount until you are comfortable again. Then slowly increase the exercises. Call your caregiver if you have problems or questions.  ° °HOME CARE INSTRUCTIONS  °Most of the following instructions are designed to prevent the dislocation of your new hip.  °Remove items at home which could result in a fall. This includes throw rugs or furniture in walking pathways.  °Continue medications as instructed at time of discharge. °· You may have some home medications which will be placed on hold until you complete the course of blood thinner medication. °· You may start showering once you are discharged home. Do not remove your dressing. °Do not put on socks or shoes without following the instructions of your caregivers.   °Sit on chairs with arms. Use the chair arms to help push yourself up when arising.  °Arrange for the use of a toilet seat elevator so you are not sitting low.  °· Walk with walker as instructed.  °You may resume a sexual relationship in one month or when given the OK by your caregiver.  °Use walker as long as suggested by your caregivers.  °You may put full weight on your legs and walk as much as is comfortable. °Avoid periods of inactivity such as sitting longer than an hour when not asleep. This helps prevent  blood clots.  °You may return to work once you are cleared by your surgeon.  °Do not drive a car for 6 weeks or until released by your surgeon.  °Do not drive while taking narcotics.  °Wear elastic stockings for two weeks following surgery during the day but you may remove then at night.  °Make sure you keep all of your appointments after your operation with all of your doctors and caregivers. You should call the office at the above phone number and make an appointment for approximately two weeks after the date of your surgery. °Please pick up a stool softener and laxative for home use as long as you are requiring pain medications. °· ICE to the affected hip every three hours for 30 minutes at a time and then as needed for pain and swelling. Continue to use ice on the hip for pain and swelling from surgery. You may notice swelling that will progress down to the foot and ankle.  This is normal after surgery.  Elevate the leg when you are not up walking on it.   °It is important for you to complete the blood thinner medication as prescribed by your doctor. °· Continue to use the breathing machine which will help keep your temperature down.  It is common for your temperature to cycle up and down following surgery, especially at night when you are not up moving around and exerting yourself.  The breathing machine keeps your lungs expanded and your temperature down. ° °RANGE OF MOTION AND STRENGTHENING EXERCISES  °These exercises are   designed to help you keep full movement of your hip joint. Follow your caregiver's or physical therapist's instructions. Perform all exercises about fifteen times, three times per day or as directed. Exercise both hips, even if you have had only one joint replacement. These exercises can be done on a training (exercise) mat, on the floor, on a table or on a bed. Use whatever works the best and is most comfortable for you. Use music or television while you are exercising so that the exercises  are a pleasant break in your day. This will make your life better with the exercises acting as a break in routine you can look forward to.  °Lying on your back, slowly slide your foot toward your buttocks, raising your knee up off the floor. Then slowly slide your foot back down until your leg is straight again.  °Lying on your back spread your legs as far apart as you can without causing discomfort.  °Lying on your side, raise your upper leg and foot straight up from the floor as far as is comfortable. Slowly lower the leg and repeat.  °Lying on your back, tighten up the muscle in the front of your thigh (quadriceps muscles). You can do this by keeping your leg straight and trying to raise your heel off the floor. This helps strengthen the largest muscle supporting your knee.  °Lying on your back, tighten up the muscles of your buttocks both with the legs straight and with the knee bent at a comfortable angle while keeping your heel on the floor.  ° °SKILLED REHAB INSTRUCTIONS: °If the patient is transferred to a skilled rehab facility following release from the hospital, a list of the current medications will be sent to the facility for the patient to continue.  When discharged from the skilled rehab facility, please have the facility set up the patient's Home Health Physical Therapy prior to being released. Also, the skilled facility will be responsible for providing the patient with their medications at time of release from the facility to include their pain medication and their blood thinner medication. If the patient is still at the rehab facility at time of the two week follow up appointment, the skilled rehab facility will also need to assist the patient in arranging follow up appointment in our office and any transportation needs. ° °MAKE SURE YOU:  °Understand these instructions.  °Will watch your condition.  °Will get help right away if you are not doing well or get worse. ° °Pick up stool softner and  laxative for home use following surgery while on pain medications. °Do not remove your dressing. °The dressing is waterproof--it is OK to take showers. °Continue to use ice for pain and swelling after surgery. °Do not use any lotions or creams on the incision until instructed by your surgeon. °Total Hip Protocol. ° ° °

## 2017-08-28 LAB — CBC
HCT: 30.5 % — ABNORMAL LOW (ref 39.0–52.0)
HEMOGLOBIN: 10.4 g/dL — AB (ref 13.0–17.0)
MCH: 31.4 pg (ref 26.0–34.0)
MCHC: 34.1 g/dL (ref 30.0–36.0)
MCV: 92.1 fL (ref 78.0–100.0)
Platelets: 209 10*3/uL (ref 150–400)
RBC: 3.31 MIL/uL — AB (ref 4.22–5.81)
RDW: 12 % (ref 11.5–15.5)
WBC: 12.4 10*3/uL — ABNORMAL HIGH (ref 4.0–10.5)

## 2017-08-28 LAB — BASIC METABOLIC PANEL
Anion gap: 8 (ref 5–15)
BUN: 10 mg/dL (ref 6–20)
CALCIUM: 9.1 mg/dL (ref 8.9–10.3)
CO2: 26 mmol/L (ref 22–32)
CREATININE: 1.07 mg/dL (ref 0.61–1.24)
Chloride: 100 mmol/L — ABNORMAL LOW (ref 101–111)
GFR calc Af Amer: >60 mL/min (ref >60)
Glucose, Bld: 128 mg/dL — ABNORMAL HIGH (ref 65–99)
POTASSIUM: 4.6 mmol/L (ref 3.5–5.1)
Sodium: 134 mmol/L — ABNORMAL LOW (ref 135–145)

## 2017-08-28 MED ORDER — OXYCODONE HCL 5 MG PO TABS: 5.0000 mg | ORAL_TABLET | ORAL | 0 refills | Status: AC | PRN

## 2017-08-28 MED ORDER — ASPIRIN 81 MG PO TABS: 81.0000 mg | ORAL_TABLET | Freq: Two times a day (BID) | ORAL | 1 refills | Status: AC

## 2017-08-28 NOTE — Progress Notes (Signed)
PROGRESS NOTE  Austin BarrWilliam H Suddreth Jr. ZOX:[id number] DOB: 1956-02-13 DOA: 08/24/2017 PCP: Lonie Peakonroy, Nathan, PA-C  HPI/Recap of past 24 hours: Austin BarrWilliam H Girtman Jr. is a 62 y.o. male past medical history significant for hypertension and stroke presents the emergency room with right hip pain.  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 Patient had a mechanical fall at home.  Denies any head injury. Went to see PCP while on crutches. Was sent to ED for eval.  08/25/2017: Patient seen and examined at his bedside.  He denies any pain at the time of this examination.  Orthopedic surgery is following and planning for a total hip replacement on Monday, 08/27/2017.  08/26/17: states pain is well controlled on current management when he does not move. Plan for surgery tomorrow.  08/27/17: No new complaints. Planned surgery today. POD # 0 post right total hip athroplasty.  08/28/17: POD #1 post right hip repair.  Patient is very hard of hearing.  Complains of right hip pain.  Pain management in place will be adjusted.  Denies nausea.  Assessment/Plan: Active Problems:   Hip fracture (HCC)   Displaced fracture of right femoral neck (HCC)  Right hip fracture secondary to mechanical fall POD #1 post right total hip athroplasty Orthopedic following  Pain management in place Increase dose to 10 mg from OxyIR 5 mg every 4 hours as needed for severe pain continue Senokot twice daily and Dulcolax as needed Added MiraLAX this morning daily  Supraventricular tachycardia Most likely related to pain Continue telemetry Control pain  Hypertension Blood pressures well controlled Continue HCTZ Continue gentle IV hydration  Hypokalemia, resolved Potassium 4.6 from 3.4 Repleted BMP in the morning  Ambulatory dysfunction due to right hip fracture PT evaluated and recommended home health PT Fall precautions  History of hemorrhagic stroke Patient has limited use of his  right leg and right arm Fall precautions   Code Status: Full Family Communication: None at bedside  Disposition Plan: SNF when clinically stable after his surgery   Consultants:  Orthopedic surgery  Procedures:  None  Antimicrobials:  None  DVT prophylaxis: SCDs  Objective: Vitals:   08/28/17 0002 08/28/17 0514 08/28/17 1202 08/28/17 1413  BP: 126/86 116/87 134/88 134/84  Pulse: 73 77 69 89  Resp: 18 17 18 18   Temp: 98 F (36.7 C) 98.4 F (36.9 C) 99 F (37.2 C) 98.8 F (37.1 C)  TempSrc: Oral Oral Oral Oral  SpO2: 97% 98% 99% 100%  Weight:      Height:        Intake/Output Summary (Last 24 hours) at 08/28/2017 1506 Last data filed at 08/28/2017 1406 Gross per 24 hour  Intake 2373 ml  Output 2925 ml  Net -552 ml   Filed Weights   08/24/17 1408 08/27/17 1109  Weight: 59 kg (130 lb) 59 kg (130 lb)    Exam: 08/28/17  General: Very hard of hearing.  62 year old African-American male thin build in no acute distress.   Cardiovascular: Regular rate and rhythm with no rubs or gallops.  No thyromegaly or JVD present.  Respiratory: Clear to auscultation with no wheezes no rales  Abdomen: Soft nontender nondistended normal bowel sounds x4  Musculoskeletal: Right lower extremity is medially rotated and shortened compared to left lower extremity.  Skin: No noted destructive lesion  Psychiatry: Mood is appropriate for condition     Data Reviewed: CBC: Recent Labs  Lab 08/24/17 1351 08/27/17 0530 08/28/17 0600  WBC 11.5* 7.3 12.4*  NEUTROABS 8.2*  --   --   HGB 12.9* 12.4* 10.4*  HCT 37.2* 35.0* 30.5*  MCV 92.8 92.3 92.1  PLT 198 204 209   Basic Metabolic Panel: Recent Labs  Lab 08/24/17 1351 08/27/17 0530 08/28/17 0600  NA 137 135 134*  K 3.4* 4.0 4.6  CL 100* 100* 100*  CO2 25 24 26   GLUCOSE 99 96 128*  BUN 8 9 10   CREATININE 0.81 0.98 1.07  CALCIUM 9.2 8.8* 9.1  MG  --  2.3  --    GFR: Estimated Creatinine Clearance: 59 mL/min (by  C-G formula based on SCr of 1.07 mg/dL). Liver Function Tests: Recent Labs  Lab 08/27/17 0530  AST 27  ALT 21  ALKPHOS 53  BILITOT 1.1  PROT 6.8  ALBUMIN 3.3*   No results for input(s): LIPASE, AMYLASE in the last 168 hours. No results for input(s): AMMONIA in the last 168 hours. Coagulation Profile: Recent Labs  Lab 08/24/17 1620  INR 1.11   Cardiac Enzymes: No results for input(s): CKTOTAL, CKMB, CKMBINDEX, TROPONINI in the last 168 hours. BNP (last 3 results) No results for input(s): PROBNP in the last 8760 hours. HbA1C: No results for input(s): HGBA1C in the last 72 hours. CBG: No results for input(s): GLUCAP in the last 168 hours. Lipid Profile: No results for input(s): CHOL, HDL, LDLCALC, TRIG, CHOLHDL, LDLDIRECT in the last 72 hours. Thyroid Function Tests: No results for input(s): TSH, T4TOTAL, FREET4, T3FREE, THYROIDAB in the last 72 hours. Anemia Panel: No results for input(s): VITAMINB12, FOLATE, FERRITIN, TIBC, IRON, RETICCTPCT in the last 72 hours. Urine analysis: No results found for: COLORURINE, APPEARANCEUR, LABSPEC, PHURINE, GLUCOSEU, HGBUR, BILIRUBINUR, KETONESUR, PROTEINUR, UROBILINOGEN, NITRITE, LEUKOCYTESUR Sepsis Labs: @LABRCNTIP (procalcitonin:4,lacticidven:4)  ) Recent Results (from the past 240 hour(s))  MRSA PCR Screening     Status: None   Collection Time: 08/27/17  7:53 AM  Result Value Ref Range Status   MRSA by PCR NEGATIVE NEGATIVE Final    Comment:        The GeneXpert MRSA Assay (FDA approved for NASAL specimens only), is one component of a comprehensive MRSA colonization surveillance program. It is not intended to diagnose MRSA infection nor to guide or monitor treatment for MRSA infections. Performed at [facility], [address] [place], [place] [address]       Studies: Pelvis Portable  Result Date: 08/27/2017 CLINICAL DATA:  Status post right hip replacement today. EXAM: PORTABLE PELVIS 1-2 VIEWS COMPARISON:   CT right hip 08/24/2017. FINDINGS: New right total hip arthroplasty is in place. The device is located. No fracture. Left hip is unremarkable. Gas in the soft tissues related to surgery noted. IMPRESSION: Status post right total hip replacement.  No acute finding. Electronically Signed   By: Drusilla Kanner M.D.   On: 08/27/2017 15:56    Scheduled Meds: . docusate sodium  100 mg Oral BID  . enoxaparin (LOVENOX) injection  40 mg Subcutaneous Q24H  . hydrochlorothiazide  12.5 mg Oral Daily  . sodium chloride flush  3 mL Intravenous Q12H    Continuous Infusions: . lactated ringers 10 mL/hr at 08/27/17 2144     LOS: 4 days     Darlin Drop, MD [facility] Pager [phone]  If 7PM-7AM, please contact night-coverage [email or web address] Password [email or web address] 08/28/2017, 3:06 PM

## 2017-08-28 NOTE — Care Management Important Message (Signed)
Important Message  Patient Details  Name: Austin BarrWilliam H Mote Jr. MRN: [id number] Date of Birth: 05-25-1955   Medicare Important Message Given:  Yes    Kindra Bickham Stefan ChurchBratton 08/28/2017, 12:37 PM
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
## 2017-08-28 NOTE — Plan of Care (Signed)
  Problem: Health Behavior/Discharge Planning: Goal: Ability to manage health-related needs will improve Outcome: Progressing   Problem: Clinical Measurements: Goal: Ability to maintain clinical measurements within normal limits will improve Outcome: Progressing Goal: Will remain free from infection Outcome: Progressing Goal: Diagnostic test results will improve Outcome: Progressing Goal: Respiratory complications will improve Outcome: Progressing Goal: Cardiovascular complication will be avoided Outcome: Progressing   Problem: Nutrition: Goal: Adequate nutrition will be maintained Outcome: Progressing   Problem: Elimination: Goal: Will not experience complications related to bowel motility Outcome: Progressing Goal: Will not experience complications related to urinary retention Outcome: Progressing   Problem: Pain Managment: Goal: General experience of comfort will improve Outcome: Progressing   Problem: Safety: Goal: Ability to remain free from injury will improve Outcome: Progressing   Problem: Education: Goal: Verbalization of understanding the information provided (i.e., activity precautions, restrictions, etc) will improve Outcome: Progressing   Problem: Activity: Goal: Ability to ambulate and perform ADLs will improve Outcome: Progressing   Problem: Clinical Measurements: Goal: Postoperative complications will be avoided or minimized Outcome: Progressing   Problem: Self-Concept: Goal: Ability to maintain and perform role responsibilities to the fullest extent possible will improve Outcome: Progressing   Problem: Pain Management: Goal: Pain level will decrease Outcome: Progressing

## 2017-08-28 NOTE — Progress Notes (Signed)
    Subjective:  Patient reports pain as moderate to severe.  Denies N/V/CP/SOB. C/o right hip pain.  Objective:   VITALS:   Vitals:   08/27/17 2121 08/28/17 0002 08/28/17 0514 08/28/17 1202  BP: 124/90 126/86 116/87 134/88  Pulse: 81 73 77 69  Resp: 19 18 17 18   Temp: 97.9 F (36.6 C) 98 F (36.7 C) 98.4 F (36.9 C) 99 F (37.2 C)  TempSrc: Oral Oral Oral Oral  SpO2: 99% 97% 98% 99%  Weight:      Height:        NAD ABD soft Sensation intact distally Intact pulses distally Dorsiflexion/Plantar flexion intact Incision: scant drainage Compartment soft   Lab Results  Component Value Date   WBC 12.4 (H) 08/28/2017   HGB 10.4 (L) 08/28/2017   HCT 30.5 (L) 08/28/2017   MCV 92.1 08/28/2017   PLT 209 08/28/2017   BMET    Component Value Date/Time   NA 134 (L) 08/28/2017 0600   K 4.6 08/28/2017 0600   CL 100 (L) 08/28/2017 0600   CO2 26 08/28/2017 0600   GLUCOSE 128 (H) 08/28/2017 0600   BUN 10 08/28/2017 0600   CREATININE 1.07 08/28/2017 0600   CALCIUM 9.1 08/28/2017 0600   GFRNONAA >60 08/28/2017 0600   GFRAA >60 08/28/2017 0600     Assessment/Plan: 1 Day Post-Op   Active Problems:   Hip fracture (HCC)   Displaced fracture of right femoral neck (HCC)   WBAT with walker DVT ppx: lovenox in house --> home on ASA, SCDs, TEDS PO pain control PT/OT Dispo: D/C home with HHPT when medically ready    Iline OvenBrian J Daxon Kyne 08/28/2017, 1:11 PM   Samson FredericBrian Tiane Szydlowski, MD Cell [phone]

## 2017-08-29 ENCOUNTER — Encounter (HOSPITAL_COMMUNITY): Payer: Self-pay | Admitting: Orthopedic Surgery

## 2017-08-29 ENCOUNTER — Other Ambulatory Visit: Payer: Self-pay

## 2017-08-29 DIAGNOSIS — S72001A Fracture of unspecified part of neck of right femur, initial encounter for closed fracture: Secondary | ICD-10-CM

## 2017-08-29 LAB — CBC
HEMATOCRIT: 26.3 % — AB (ref 39.0–52.0)
HEMOGLOBIN: 9.1 g/dL — AB (ref 13.0–17.0)
MCH: 31.7 pg (ref 26.0–34.0)
MCHC: 34.6 g/dL (ref 30.0–36.0)
MCV: 91.6 fL (ref 78.0–100.0)
Platelets: 195 10*3/uL (ref 150–400)
RBC: 2.87 MIL/uL — AB (ref 4.22–5.81)
RDW: 11.9 % (ref 11.5–15.5)
WBC: 7.4 10*3/uL (ref 4.0–10.5)

## 2017-08-29 LAB — COMPREHENSIVE METABOLIC PANEL
ALBUMIN: 3 g/dL — AB (ref 3.5–5.0)
ALK PHOS: 66 U/L (ref 38–126)
ALT: 75 U/L — ABNORMAL HIGH (ref 17–63)
AST: 54 U/L — ABNORMAL HIGH (ref 15–41)
Anion gap: 10 (ref 5–15)
BILIRUBIN TOTAL: 1.1 mg/dL (ref 0.3–1.2)
BUN: 8 mg/dL (ref 6–20)
CO2: 28 mmol/L (ref 22–32)
Calcium: 8.8 mg/dL — ABNORMAL LOW (ref 8.9–10.3)
Chloride: 98 mmol/L — ABNORMAL LOW (ref 101–111)
Creatinine, Ser: 0.89 mg/dL (ref 0.61–1.24)
GFR calc Af Amer: >60 mL/min (ref >60)
GFR calc non Af Amer: >60 mL/min (ref >60)
GLUCOSE: 99 mg/dL (ref 65–99)
POTASSIUM: 3.8 mmol/L (ref 3.5–5.1)
Sodium: 136 mmol/L (ref 135–145)
TOTAL PROTEIN: 6.3 g/dL — AB (ref 6.5–8.1)

## 2017-08-29 MED ORDER — PNEUMOCOCCAL VAC POLYVALENT 25 MCG/0.5ML IJ INJ
0.5000 mL | INJECTION | INTRAMUSCULAR | Status: AC
  Administered 2017-08-30: 0.5 mL via INTRAMUSCULAR

## 2017-08-29 NOTE — Progress Notes (Signed)
[facility]                                                                              Patient Demographics  Austin Boone, is a 62 y.o. male, DOB - 04/13/1956, VOZ:[id number]  Admit date - 08/24/2017   Admitting Physician Haydee Salter, MD  Outpatient Primary MD for the patient is Lonie Peak, PA-C  Outpatient specialists:   LOS - 5  days   Medical records reviewed and are as summarized below:    Chief Complaint  Patient presents with  . Hip Pain       Brief summary   Patient is a 62 year old male with history of hypertension, CVA presented to ED with right hip pain.  Patient had a mechanical fall at home, was found to have displaced right femoral neck fracture.   Assessment & Plan    Right hip fracture secondary to mechanical fall Orthopedics was consulted, patient underwent-right total hip arthroplasty on 4/8, postop day 2 -Continue PT OT, recommended home health with 24/7 supervision, patient lives alone.  Social work consult placed for skilled nursing facility. -H&H stable,  -DVT prophylaxis, pain control per orthopedics  Hypertension -Continue HCTZ, BP currently soft  History of prior hemorrhagic stroke, limited use of right leg, arm -Continue fall precautions  Code Status: Full CODE STATUS DVT Prophylaxis: Lovenox Family Communication: Discussed in detail with the patient, all imaging results, lab results explained to the patient    Disposition Plan: Possible DC in a.m.  Time Spent in minutes :  Procedures:  Right hip arthroplasty  Consultants:   Orthopedic  Antimicrobials:      Medications  Scheduled Meds: . docusate sodium  100 mg Oral BID  . enoxaparin (LOVENOX) injection  40 mg Subcutaneous Q24H  . hydrochlorothiazide  12.5 mg Oral Daily  . [START ON 08/30/2017] pneumococcal 23 valent vaccine  0.5 mL Intramuscular Tomorrow-1000  . sodium chloride flush  3 mL Intravenous Q12H   Continuous Infusions: .  lactated ringers 10 mL/hr at 08/27/17 2144   PRN Meds:.acetaminophen **OR** acetaminophen, hydrALAZINE, menthol-cetylpyridinium **OR** phenol, metoCLOPramide **OR** metoCLOPramide (REGLAN) injection, ondansetron **OR** ondansetron (ZOFRAN) IV, oxyCODONE   Antibiotics   Anti-infectives (From admission, onward)   Start     Dose/Rate Route Frequency Ordered Stop   08/27/17 2030  ceFAZolin (ANCEF) IVPB 2g/100 mL premix     2 g 200 mL/hr over 30 Minutes Intravenous Every 6 hours 08/27/17 1627 08/28/17 0334   08/27/17 1030  ceFAZolin (ANCEF) IVPB 2g/100 mL premix     2 g 200 mL/hr over 30 Minutes Intravenous On call to O.R. 08/27/17 1017 08/27/17 1354        Subjective:   Austin Boone was seen and examined today.  Denies any specific complaints.  Uncontrolled.  Patient denies dizziness, chest pain, shortness of breath, abdominal pain, N/V/D/C. No acute events overnight.    Objective:   Vitals:   08/28/17 1725 08/28/17 2040 08/29/17 0413 08/29/17 1209  BP: (!) 120/100 117/75 118/81 109/75  Pulse: 78 70 75 67  Resp: (!) 22 17 16 18   Temp: 98.6 F (37 C) 99.2 F (37.3 C)  98.2 F (36.8 C) 98 F (36.7 C)  TempSrc: Oral Oral Oral Oral  SpO2: 100% 100% 97% 100%  Weight:      Height:        Intake/Output Summary (Last 24 hours) at 08/29/2017 1436 Last data filed at 08/29/2017 1100 Gross per 24 hour  Intake 2720 ml  Output 2825 ml  Net -105 ml     Wt Readings from Last 3 Encounters:  08/27/17 59 kg (130 lb)     Exam  General: Alert and oriented x 3, NAD  Eyes:  HEENT:   Cardiovascular: S1 S2 auscultated, Regular rate and rhythm.  Respiratory: Clear to auscultation bilaterally, no wheezing, rales or rhonchi  Gastrointestinal: Soft, nontender, nondistended, + bowel sounds  Ext: no pedal edema bilaterally  Neuro: no new deficit, right-sided chronic weakness  Musculoskeletal: No digital cyanosis, clubbing  Skin: No rashes  Psych: Normal affect and  demeanor, alert and oriented x3    Data Reviewed:  I have personally reviewed following labs and imaging studies  Micro Results Recent Results (from the past 240 hour(s))  MRSA PCR Screening     Status: None   Collection Time: 08/27/17  7:53 AM  Result Value Ref Range Status   MRSA by PCR NEGATIVE NEGATIVE Final    Comment:        The GeneXpert MRSA Assay (FDA approved for NASAL specimens only), is one component of a comprehensive MRSA colonization surveillance program. It is not intended to diagnose MRSA infection nor to guide or monitor treatment for MRSA infections. Performed at [facility], [address], [place], [place] [address]     Radiology Reports Dg Chest 2 View  Result Date: 08/24/2017 CLINICAL DATA:  Preop for right femur fracture repair. EXAM: CHEST - 2 VIEW COMPARISON:  Radiographs of August 21, 2007. FINDINGS: The heart size and mediastinal contours are within normal limits. Both lungs are clear. No pneumothorax or pleural effusion is noted. Old left rib fractures are noted. IMPRESSION: No active cardiopulmonary disease. Electronically Signed   By: Lupita Raider, M.D.   On: 08/24/2017 14:56   Pelvis Portable  Result Date: 08/27/2017 CLINICAL DATA:  Status post right hip replacement today. EXAM: PORTABLE PELVIS 1-2 VIEWS COMPARISON:  CT right hip 08/24/2017. FINDINGS: New right total hip arthroplasty is in place. The device is located. No fracture. Left hip is unremarkable. Gas in the soft tissues related to surgery noted. IMPRESSION: Status post right total hip replacement.  No acute finding. Electronically Signed   By: Drusilla Kanner M.D.   On: 08/27/2017 15:56   Dg Pelvis Portable  Result Date: 08/24/2017 CLINICAL DATA:  Displaced right femoral neck fracture. EXAM: PORTABLE PELVIS 1-2 VIEWS COMPARISON:  08/24/2017 right hip and femur radiographs as well as CT of the right hip. FINDINGS: Patient is slightly oblique this AP portable supine view of the  pelvis. Femoral heads are seated within their acetabular components. Redemonstration of known right femoral neck fracture involving the basicervical portion medially with impaction along the superolateral aspect of the femoral head-neck junction. The pubic rami, bony pelvis and left proximal femur are intact. Acetabular components are intact. The degree of minimal offset involving the base of the right femoral neck is less apparent on this study. IMPRESSION: Acute right femoral neck fracture along the basicervical portion medially with impaction of the femoral neck on femoral head superolaterally. No joint dislocation. The degree of offset involving the fracture at the base of the right femoral neck  is less conspicuous on this study. Electronically Signed   By: Tollie Ethavid  Kwon M.D.   On: 08/24/2017 18:57   Ct Hip Right Wo Contrast  Result Date: 08/24/2017 CLINICAL DATA:  62 year old male status post fall over dog yesterday presents with right hip pain. EXAM: CT OF THE RIGHT HIP WITHOUT CONTRAST TECHNIQUE: Multidetector CT imaging of the right hip was performed according to the standard protocol. Multiplanar CT image reconstructions were also generated. COMPARISON:  Radiographs from 08/24/2017 of the right hip. FINDINGS: Bones/Joint/Cartilage Acute, basicervical fracture of the right femur with impaction of the femoral neck on femoral head along the superolateral aspect also noted, series 6/39. There is 1.3 mm of lateral displacement of the main distal fracture fragment at the base of the femoral neck in addition. Pubic rami appear intact. Linear lucency with sclerotic margins compatible with a vascular channel is seen of the right superior pubic ramus, series 6/26. The femoral head is seated within its acetabular component. Degenerative acetabular spurring is noted off the of the acetabulum. No hip joint effusion. Intact greater and lesser trochanters. Small focus of fibrocystic change at the femoral head-neck  juncture, series 7, image 32 which can be seen as sequela of femoroacetabular impingement. Ligaments Suboptimally assessed by CT. Muscles and Tendons No intramuscular hemorrhage or atrophy. Soft tissues Subcutaneous fatty induration along the lateral aspect of the right hip consistent with a contusion. Partially included right-sided hydrocele within the scrotum. IMPRESSION: 1. Acute comminuted fracture of the right femoral neck with minimally displaced basicervical fracture component as well as impaction of the femoral head upon the femoral head superolaterally. 2. No joint dislocation or joint effusion. 3. Partially included right sided hydrocele within the included scrotal sac. Electronically Signed   By: Tollie Ethavid  Kwon M.D.   On: 08/24/2017 18:00   Dg C-arm 1-60 Min  Result Date: 08/27/2017 CLINICAL DATA:  Right hip replacement. EXAM: DG C-ARM 61-120 MIN; OPERATIVE RIGHT HIP WITH PELVIS COMPARISON:  CT of the right hip and right hip radiographs 08/24/2017. FLUOROSCOPY TIME:  Fluoroscopy Time:  13 seconds Number of Acquired Spot Images: 0 FINDINGS: To AP intraoperative fluoro spot images demonstrate a right total hip arthroplasty. Femoral and acetabular components appear to be in place. No new fractures are present. IMPRESSION: Intraoperative fluoro spot images of right total hip arthroplasty without radiographic evidence for complication. Electronically Signed   By: Marin Robertshristopher  Mattern M.D.   On: 08/27/2017 17:17   Dg Hip Operative Unilat W Or W/o Pelvis Right  Result Date: 08/27/2017 CLINICAL DATA:  Right hip replacement. EXAM: DG C-ARM 61-120 MIN; OPERATIVE RIGHT HIP WITH PELVIS COMPARISON:  CT of the right hip and right hip radiographs 08/24/2017. FLUOROSCOPY TIME:  Fluoroscopy Time:  13 seconds Number of Acquired Spot Images: 0 FINDINGS: To AP intraoperative fluoro spot images demonstrate a right total hip arthroplasty. Femoral and acetabular components appear to be in place. No new fractures are present.  IMPRESSION: Intraoperative fluoro spot images of right total hip arthroplasty without radiographic evidence for complication. Electronically Signed   By: Marin Robertshristopher  Mattern M.D.   On: 08/27/2017 17:17   Dg Hip Unilat With Pelvis 2-3 Views Right  Result Date: 08/24/2017 CLINICAL DATA:  Right hip pain after falling yesterday. EXAM: DG HIP (WITH OR WITHOUT PELVIS) 2-3V RIGHT COMPARISON:  05/27/2005 FINDINGS: Suspicion of minimally displaced to nondisplaced femoral neck fracture. Of suggest confirmation with MRI if able. If not, CT could be second choice. No other abnormality of the right hemipelvis. IMPRESSION: Suspicion of minimally  displaced to nondisplaced femoral neck fracture. Recommend confirmation with MRI. If not able, CT would be second choice. Electronically Signed   By: Paulina Fusi M.D.   On: 08/24/2017 12:26   Dg Femur Min 2 Views Right  Result Date: 08/24/2017 CLINICAL DATA:  Right hip pain after fall yesterday. EXAM: RIGHT FEMUR 2 VIEWS COMPARISON:  None. FINDINGS: Minimal displaced fracture is seen involving the proximal right femoral neck. No dislocation is noted. No soft tissue abnormality is noted. IMPRESSION: Minimally displaced proximal right femoral neck fracture. Electronically Signed   By: Lupita Raider, M.D.   On: 08/24/2017 14:57    Lab Data:  CBC: Recent Labs  Lab 08/24/17 1351 08/27/17 0530 08/28/17 0600 08/29/17 0711  WBC 11.5* 7.3 12.4* 7.4  NEUTROABS 8.2*  --   --   --   HGB 12.9* 12.4* 10.4* 9.1*  HCT 37.2* 35.0* 30.5* 26.3*  MCV 92.8 92.3 92.1 91.6  PLT 198 204 209 195   Basic Metabolic Panel: Recent Labs  Lab 08/24/17 1351 08/27/17 0530 08/28/17 0600 08/29/17 0711  NA 137 135 134* 136  K 3.4* 4.0 4.6 3.8  CL 100* 100* 100* 98*  CO2 25 24 26 28   GLUCOSE 99 96 128* 99  BUN 8 9 10 8   CREATININE 0.81 0.98 1.07 0.89  CALCIUM 9.2 8.8* 9.1 8.8*  MG  --  2.3  --   --    GFR: Estimated Creatinine Clearance: 70.9 mL/min (by C-G formula based on SCr  of 0.89 mg/dL). Liver Function Tests: Recent Labs  Lab 08/27/17 0530 08/29/17 0711  AST 27 54*  ALT 21 75*  ALKPHOS 53 66  BILITOT 1.1 1.1  PROT 6.8 6.3*  ALBUMIN 3.3* 3.0*   No results for input(s): LIPASE, AMYLASE in the last 168 hours. No results for input(s): AMMONIA in the last 168 hours. Coagulation Profile: Recent Labs  Lab 08/24/17 1620  INR 1.11   Cardiac Enzymes: No results for input(s): CKTOTAL, CKMB, CKMBINDEX, TROPONINI in the last 168 hours. BNP (last 3 results) No results for input(s): PROBNP in the last 8760 hours. HbA1C: No results for input(s): HGBA1C in the last 72 hours. CBG: No results for input(s): GLUCAP in the last 168 hours. Lipid Profile: No results for input(s): CHOL, HDL, LDLCALC, TRIG, CHOLHDL, LDLDIRECT in the last 72 hours. Thyroid Function Tests: No results for input(s): TSH, T4TOTAL, FREET4, T3FREE, THYROIDAB in the last 72 hours. Anemia Panel: No results for input(s): VITAMINB12, FOLATE, FERRITIN, TIBC, IRON, RETICCTPCT in the last 72 hours. Urine analysis: No results found for: COLORURINE, APPEARANCEUR, LABSPEC, PHURINE, GLUCOSEU, HGBUR, BILIRUBINUR, KETONESUR, PROTEINUR, UROBILINOGEN, NITRITE, LEUKOCYTESUR   Ripudeep Rai M.D. [facility] 08/29/2017, 2:36 PM  Pager: [phone] Between 7am to 7pm - call Pager - [phone]  After 7pm go to [email or web address] - password [email or web address]  Call night coverage person covering after 7pm

## 2017-08-29 NOTE — Social Work (Addendum)
CSW was requested to meet with patient, however, PT is recommending home health. CSW f/u with RNCM as PT is recommending home. Pt has ambulated 200 feet with min assistance and may not be approved for short term rehab.  CSW will continue to follow as warranted.  Keene BreathPatricia Coleson Kant, LCSW Clinical Social Worker [phone]

## 2017-08-29 NOTE — Progress Notes (Signed)
Physical Therapy Treatment Patient Details Name: Austin Boone. MRN: [id number] DOB: August 11, 1955 Today's Date: 08/29/2017    History of Present Illness Arnav Cregg. is a 62 y.o. male who complains of pain with ambulation and inability to put full weight on his right leg following a fall over a dog.  He ambulates without any assistive devices at baseline.  However he does wear a 4 inch heel lift on his right shoe after having a segment of his right tibia resected following a traumatic open injury.  Otherwise, he is independent with all ADLs and lives alone.  He does admit to smoking less than half a pack of cigarettes a day.  Otherwise he only endorses a history of hypertension and a history of hepatitis C. Underwent direct anterior THA on 08/27/17.    PT Comments    Patient is making progress toward PT goals and tolerated ambulating 228ft with RW. Pt overall required supervision/min guard for safe OOB mobility. Pt reported doing HEP prior to session. Mother present. Continue to progress as tolerated.    Follow Up Recommendations  Home health PT     Equipment Recommendations  Rolling walker with 5" wheels    Recommendations for Other Services       Precautions / Restrictions Precautions Precautions: None Restrictions Weight Bearing Restrictions: Yes RLE Weight Bearing: Weight bearing as tolerated Other Position/Activity Restrictions: very HOH, L ear better than R    Mobility  Bed Mobility               General bed mobility comments: OOB in recliner upon arrival   Transfers Overall transfer level: Needs assistance Equipment used: Rolling walker (2 wheeled) Transfers: Sit to/from Stand Sit to Stand: Min guard         General transfer comment: cues for safety; pt tends to begin sitting far away from surface and leave RW behind   Ambulation/Gait Ambulation/Gait assistance: Min guard Ambulation Distance (Feet): 200 Feet Assistive device: Rolling walker  (2 wheeled) Gait Pattern/deviations: Antalgic;Step-through pattern;Decreased stance time - right;Decreased step length - left;Decreased step length - right;Decreased weight shift to right;Trunk flexed Gait velocity: decreased   General Gait Details: shoes donned prior to ambulating; R shoe with 4 inch lift; cues for step length symmetry, posture, and safe use of AD   Stairs            Wheelchair Mobility    Modified Rankin (Stroke Patients Only)       Balance Overall balance assessment: Mild deficits observed, not formally tested                                          Cognition Arousal/Alertness: Awake/alert Behavior During Therapy: WFL for tasks assessed/performed Overall Cognitive Status: Within Functional Limits for tasks assessed                                        Exercises      General Comments General comments (skin integrity, edema, etc.): mother present in room      Pertinent Vitals/Pain Pain Assessment: Faces Faces Pain Scale: Hurts little more Pain Location: R hip Pain Descriptors / Indicators: Sore Pain Intervention(s): Limited activity within patient's tolerance;Monitored during session;Premedicated before session;Repositioned;Ice applied    Home Living Family/patient expects to be discharged  to:: Private residence Living Arrangements: Alone                  Prior Function            PT Goals (current goals can now be found in the care plan section) Acute Rehab PT Goals Patient Stated Goal: return home or to sister's home PT Goal Formulation: With patient Time For Goal Achievement: 09/04/17 Potential to Achieve Goals: Good Progress towards PT goals: Progressing toward goals    Frequency    7X/week      PT Plan Current plan remains appropriate    Co-evaluation              AM-PAC PT "6 Clicks" Daily Activity  Outcome Measure  Difficulty turning over in bed (including adjusting  bedclothes, sheets and blankets)?: A Little Difficulty moving from lying on back to sitting on the side of the bed? : A Little Difficulty sitting down on and standing up from a chair with arms (e.g., wheelchair, bedside commode, etc,.)?: Unable Help needed moving to and from a bed to chair (including a wheelchair)?: A Little Help needed walking in hospital room?: A Little Help needed climbing 3-5 steps with a railing? : A Little 6 Click Score: 16    End of Session Equipment Utilized During Treatment: Gait belt Activity Tolerance: Patient tolerated treatment well Patient left: in chair;with chair alarm set;with call bell/phone within reach;with family/visitor present Nurse Communication: Mobility status PT Visit Diagnosis: Pain;Other abnormalities of gait and mobility (R26.89) Pain - Right/Left: Right Pain - part of body: Hip     Time: 1341-1402 PT Time Calculation (min) (ACUTE ONLY): 21 min  Charges:  $Gait Training: 8-22 mins                    G Codes:       Erline LevineKellyn Omran Keelin, PTA Pager: [phone]     Carolynne EdouardKellyn R Zaim Nitta 08/29/2017, 2:10 PM

## 2017-08-29 NOTE — Care Management Note (Signed)
Case Management Note  Patient Details  Name: Hoyle BarrWilliam H Okun Jr. MRN: [id number] Date of Birth: 12/28/1955  Subjective/Objective:   Right Hip Fx from mechanical fall, s/p R THA                 Action/Plan: NCM spoke to pt and gave permission to call to speak to mother. Pt agreeable to SNF rehab. Contacted mother via phone. She prefers [facility]. Explained CSW will fax out and get back her with facilities that accepted for rehab. Pt lives at home alone.   Expected Discharge Date:                 Expected Discharge Plan:  Skilled Nursing Facility  In-House Referral:  Clinical Social Work  Discharge planning Services  CM Consult  Post Acute Care Choice:  NA Choice offered to:  NA  DME Arranged:  N/A DME Agency:  NA  HH Arranged:  NA HH Agency:  NA  Status of Service:  Completed, signed off  If discussed at Long Length of Stay Meetings, dates discussed:    Additional Comments:  Elliot CousinShavis, Gwendalyn Mcgonagle Ellen, RN 08/29/2017, 4:01 PM

## 2017-08-30 LAB — CBC
HEMATOCRIT: 25.9 % — AB (ref 39.0–52.0)
Hemoglobin: 9.1 g/dL — ABNORMAL LOW (ref 13.0–17.0)
MCH: 32.4 pg (ref 26.0–34.0)
MCHC: 35.1 g/dL (ref 30.0–36.0)
MCV: 92.2 fL (ref 78.0–100.0)
PLATELETS: 203 10*3/uL (ref 150–400)
RBC: 2.81 MIL/uL — ABNORMAL LOW (ref 4.22–5.81)
RDW: 12.2 % (ref 11.5–15.5)
WBC: 7.9 10*3/uL (ref 4.0–10.5)

## 2017-08-30 MED ORDER — DOCUSATE SODIUM 100 MG PO CAPS: 100.0000 mg | ORAL_CAPSULE | Freq: Two times a day (BID) | ORAL | 0 refills | Status: AC | PRN

## 2017-08-30 NOTE — Care Management Note (Signed)
Case Management Note  Patient Details  Name: Austin BarrWilliam H Krupp Jr. MRN: [id number] Date of Birth: [birth year]  Subjective/Objective:  62 yr old gentleman s/p right total hip arthroplasty.                  Action/Plan:  Case manager spoke with patient concerning discharge plan and DME needs. Patient not interested in going to skilled facility, CM spoke with his sister, Austin Boone [phone]. Choice was offered for Home Health Agencies. Referral was called to Shon Milletan Phillips, [facility] Liaison. Ursula AlertWendi says that she will have patient at her residence for a few weeks, she is to be contacted to arrange therapy date and times. Case manager gave this information to [place]. Wendi's address : [address] [place], [place] [address].   Expected Discharge Date:                  Expected Discharge Plan:  Home w Home Health Services  In-House Referral:  Clinical Social Work, NA  Discharge planning Services  CM Consult  Post Acute Care Choice:  Durable Medical Equipment, Home Health Choice offered to:  Patient, Sibling  DME Arranged:  3-N-1, Walker rolling DME Agency:  [facility]  HH Arranged:  PT, OT HH Agency:  [facility]  Status of Service:  Completed, signed off  If discussed at Long Length of Stay Meetings, dates discussed:    Additional Comments:  Durenda GuthrieBrady, Brandun Pinn Naomi, RN 08/30/2017, 11:36 AM

## 2017-08-30 NOTE — Progress Notes (Signed)
Pt removed IV and tele. Refusing to put back on at this time and is possible d/c today. Will continue to monitor pt closely. Jillyn HiddenStone,Erique Kaser R, RN

## 2017-08-30 NOTE — Progress Notes (Signed)
I have notified RN CM and SW that pt is not a candidate to admit to CIR. We will sign off at this time. [phone]

## 2017-08-30 NOTE — Progress Notes (Addendum)
Physical Therapy Treatment Patient Details Name: Austin Boone. MRN: [id number] DOB: 06-Oct-1955 Today's Date: 08/30/2017    History of Present Illness Austin Boone. is a 62 y.o. male who complains of pain with ambulation and inability to put full weight on his right leg following a fall over a dog.  He ambulates without any assistive devices at baseline.  However he does wear a 4 inch heel lift on his right shoe after having a segment of his right tibia resected following a traumatic open injury.  Otherwise, he is independent with all ADLs and lives alone.  He does admit to smoking less than half a pack of cigarettes a day.  Otherwise he only endorses a history of hypertension and a history of hepatitis C. Underwent direct anterior THA on 08/27/17.    PT Comments    Patient continues to make progress toward mobility goals. Pt tolerated increased gait distance and stair training this session with supervision for safety. Continue to progress as tolerated.    Follow Up Recommendations  Home health PT; supervision/assistance 24 hours     Equipment Recommendations  Rolling walker with 5" wheels    Recommendations for Other Services       Precautions / Restrictions Precautions Precautions: None Restrictions Weight Bearing Restrictions: Yes RLE Weight Bearing: Weight bearing as tolerated Other Position/Activity Restrictions: very HOH, L ear better than R    Mobility  Bed Mobility               General bed mobility comments: pt received in ortho gym with OT present  Transfers Overall transfer level: Needs assistance Equipment used: Rolling walker (2 wheeled) Transfers: Sit to/from Stand Sit to Stand: Supervision         General transfer comment: supervision for safety  Ambulation/Gait Ambulation/Gait assistance: Supervision Ambulation Distance (Feet): 250 Feet Assistive device: Rolling walker (2 wheeled) Gait Pattern/deviations: Step-through  pattern;Trunk flexed;Decreased stride length     General Gait Details: cues for posture initially; pt with steady gait and increased cadence    Stairs Stairs: Yes   Stair Management: Two rails;Step to pattern;Forwards Number of Stairs: 2 General stair comments: cues for sequencing and technique  Wheelchair Mobility    Modified Rankin (Stroke Patients Only)       Balance Overall balance assessment: Mild deficits observed, not formally tested                                          Cognition Arousal/Alertness: Awake/alert Behavior During Therapy: WFL for tasks assessed/performed Overall Cognitive Status: Within Functional Limits for tasks assessed                                        Exercises      General Comments        Pertinent Vitals/Pain Pain Assessment: Faces Faces Pain Scale: Hurts a little bit Pain Location: R hip Pain Descriptors / Indicators: Sore Pain Intervention(s): Limited activity within patient's tolerance;Monitored during session;Repositioned    Home Living                      Prior Function            PT Goals (current goals can now be found in the care plan section) Acute  Rehab PT Goals Patient Stated Goal: return home or to sister's home PT Goal Formulation: With patient Time For Goal Achievement: 09/04/17 Potential to Achieve Goals: Good Progress towards PT goals: Progressing toward goals    Frequency    7X/week      PT Plan Current plan remains appropriate    Co-evaluation              AM-PAC PT "6 Clicks" Daily Activity  Outcome Measure  Difficulty turning over in bed (including adjusting bedclothes, sheets and blankets)?: None Difficulty moving from lying on back to sitting on the side of the bed? : None Difficulty sitting down on and standing up from a chair with arms (e.g., wheelchair, bedside commode, etc,.)?: Unable Help needed moving to and from a bed to chair  (including a wheelchair)?: A Little Help needed walking in hospital room?: A Little Help needed climbing 3-5 steps with a railing? : A Little 6 Click Score: 18    End of Session Equipment Utilized During Treatment: Gait belt Activity Tolerance: Patient tolerated treatment well Patient left: in chair;with chair alarm set;with call bell/phone within reach Nurse Communication: Mobility status PT Visit Diagnosis: Pain;Other abnormalities of gait and mobility (R26.89) Pain - Right/Left: Right Pain - part of body: Hip     Time: [id number] PT Time Calculation (min) (ACUTE ONLY): 13 min  Charges:  $Gait Training: 8-22 mins                    G Codes:       Erline LevineKellyn Iolani Twilley, PTA Pager: [phone]     Carolynne EdouardKellyn R Tahirah Sara 08/30/2017, 1:49 PM

## 2017-08-30 NOTE — Social Work (Signed)
CSW will need PT to recommend SNF as the doctor would like for patient to go to skilled nursing. The PT notes attached indicated home and the patient will not get approved for Insurance Auth for SNF  placement.  CSW will f/u with therapy.  Keene BreathPatricia Codey Burling, LCSW Clinical Social Worker [phone]

## 2017-08-30 NOTE — Progress Notes (Signed)
Pt being discharged home via wheelchair with family. Pt alert and oriented x4. VSS. Pt c/o no pain at this time. No signs of respiratory distress. Education complete and care plans resolved. IV removed with catheter intact and pt tolerated well. No further issues at this time. Pt to follow up with PCP. Jonte Shiller R, RN 

## 2017-08-30 NOTE — Progress Notes (Signed)
Occupational Therapy Treatment Patient Details Name: Austin BarrWilliam H Philson Jr. MRN: [id number] DOB: February 18, 1956 Today's Date: 08/30/2017    History of present illness Austin BarrWilliam H Demonbreun Jr. is a 62 y.o. male who complains of pain with ambulation and inability to put full weight on his right leg following a fall over a dog.  He ambulates without any assistive devices at baseline.  However he does wear a 4 inch heel lift on his right shoe after having a segment of his right tibia resected following a traumatic open injury.  Otherwise, he is independent with all ADLs and lives alone.  He does admit to smoking less than half a pack of cigarettes a day.  Otherwise he only endorses a history of hypertension and a history of hepatitis C. Underwent direct anterior THA on 08/27/17.   OT comments  Pt making steady progress towards OT goals and is motivated to work with therapy. Focus of session on tub transfer to 3:1 with pt completing with overall minguard assist using RW. Pt completing functional mobility using RW with minguard, continues to require minguard-minA for LB ADLs. Pt anticipating d/c home later today.   Follow Up Recommendations  Home health OT;Supervision/Assistance - 24 hour    Equipment Recommendations  3 in 1 bedside commode          Precautions / Restrictions Precautions Precautions: None Restrictions Weight Bearing Restrictions: Yes RLE Weight Bearing: Weight bearing as tolerated Other Position/Activity Restrictions: very HOH, L ear better than R       Mobility Bed Mobility               General bed mobility comments: OOB in recliner upon arrival   Transfers Overall transfer level: Needs assistance Equipment used: Rolling walker (2 wheeled) Transfers: Sit to/from Stand Sit to Stand: Supervision         General transfer comment: supervision and intermittent cues for safety as pt attempts to stand without RW in front of him     Balance Overall balance assessment:  Mild deficits observed, not formally tested                                         ADL either performed or assessed with clinical judgement   ADL Overall ADL's : Needs assistance/impaired                     Lower Body Dressing: Sit to/from stand;Min guard Lower Body Dressing Details (indicate cue type and reason): pt doffing/donning shoes during session with setup assist          Tub/ Shower Transfer: Tub transfer;Minimal assistance;Min guard;Ambulation;3 in 1;Rolling walker Tub/Shower Transfer Details (indicate cue type and reason): verbal cues for sequence/technique and hand placement with good carryover; pt demonstrating x2  Functional mobility during ADLs: Rolling walker;Supervision/safety                         Cognition Arousal/Alertness: Awake/alert Behavior During Therapy: WFL for tasks assessed/performed Overall Cognitive Status: Within Functional Limits for tasks assessed  Pertinent Vitals/ Pain       Pain Assessment: Faces Faces Pain Scale: Hurts little more Pain Location: R hip Pain Descriptors / Indicators: Sore Pain Intervention(s): Monitored during session                                                          Frequency  Min 2X/week        Progress Toward Goals  OT Goals(current goals can now be found in the care plan section)  Progress towards OT goals: Progressing toward goals  Acute Rehab OT Goals Patient Stated Goal: return home or to sister's home OT Goal Formulation: With patient Time For Goal Achievement: 09/11/17 Potential to Achieve Goals: Good  Plan Discharge plan remains appropriate                    AM-PAC PT "6 Clicks" Daily Activity     Outcome Measure   Help from another person eating meals?: None Help from another person taking care of personal grooming?: A Little Help  from another person toileting, which includes using toliet, bedpan, or urinal?: A Little Help from another person bathing (including washing, rinsing, drying)?: A Little Help from another person to put on and taking off regular upper body clothing?: None Help from another person to put on and taking off regular lower body clothing?: A Little 6 Click Score: 20    End of Session Equipment Utilized During Treatment: Gait belt;Rolling walker  OT Visit Diagnosis: Other abnormalities of gait and mobility (R26.89)   Activity Tolerance Patient tolerated treatment well   Patient Left in chair;with call bell/phone within reach;with chair alarm set   Nurse Communication Mobility status        Time: 1610-9604 OT Time Calculation (min): 12 min  Charges: OT General Charges $OT Visit: 1 Visit OT Treatments $Self Care/Home Management : 8-22 mins  Marcy Siren, OT Pager [phone] 08/30/2017    Orlando Penner 08/30/2017, 1:06 PM

## 2017-08-30 NOTE — Progress Notes (Signed)
[facility]  CONSULT SERVICE NOTE    I have reviewed chart. Pt moving well. Had uncomplicated right hip fracture/THA. Agree with HH recs. Will defer consult.      Ranelle OysterZachary T. Swartz, MD, [place] DomFAAPMR

## 2017-08-30 NOTE — Progress Notes (Signed)
Physical Therapy Treatment Patient Details Name: Austin Boone. MRN: [id number] DOB: 1956/04/30 Today's Date: 08/30/2017    History of Present Illness Cosme Jacob. is a 62 y.o. male who complains of pain with ambulation and inability to put full weight on his right leg following a fall over a dog.  He ambulates without any assistive devices at baseline.  However he does wear a 4 inch heel lift on his right shoe after having a segment of his right tibia resected following a traumatic open injury.  Otherwise, he is independent with all ADLs and lives alone.  He does admit to smoking less than half a pack of cigarettes a day.  Otherwise he only endorses a history of hypertension and a history of hepatitis C. Underwent direct anterior THA on 08/27/17.    PT Comments    Patient doing well with therapy this afternoon. Ambulating further distances with improved mechanics. Patient to d/c home with HHPT, feel he will continue to do well. 320' with supervision and use of RW, no LOB due to pain or fatigue.    Follow Up Recommendations  Home health PT     Equipment Recommendations  Rolling walker with 5" wheels    Recommendations for Other Services       Precautions / Restrictions Precautions Precautions: None Restrictions Weight Bearing Restrictions: Yes RLE Weight Bearing: Weight bearing as tolerated Other Position/Activity Restrictions: very HOH, L ear better than R    Mobility  Bed Mobility               General bed mobility comments: in chair at entry  Transfers Overall transfer level: Needs assistance Equipment used: Rolling walker (2 wheeled) Transfers: Sit to/from Stand Sit to Stand: Supervision         General transfer comment: supervision for safety  Ambulation/Gait Ambulation/Gait assistance: Supervision Ambulation Distance (Feet): 320 Feet Assistive device: Rolling walker (2 wheeled) Gait Pattern/deviations: Step-through pattern;Trunk  flexed;Decreased stride length     General Gait Details: Patient with step through gait, no LOB, supervision with RW.    Stairs Stairs: Yes   Stair Management: Two rails;Step to pattern;Forwards Number of Stairs: 2 General stair comments: cues for sequencing and technique  Wheelchair Mobility    Modified Rankin (Stroke Patients Only)       Balance Overall balance assessment: Mild deficits observed, not formally tested                                          Cognition Arousal/Alertness: Awake/alert Behavior During Therapy: WFL for tasks assessed/performed Overall Cognitive Status: Within Functional Limits for tasks assessed                                 General Comments: appears WFL but sometimes hard to communicate due to [facility]      Exercises      General Comments        Pertinent Vitals/Pain Pain Assessment: Faces Faces Pain Scale: Hurts a little bit Pain Location: R hip Pain Descriptors / Indicators: Sore Pain Intervention(s): Limited activity within patient's tolerance;Monitored during session;Premedicated before session;Repositioned    Home Living                      Prior Function  PT Goals (current goals can now be found in the care plan section) Acute Rehab PT Goals Patient Stated Goal: return home or to sister's home PT Goal Formulation: With patient Time For Goal Achievement: 09/04/17 Potential to Achieve Goals: Good Progress towards PT goals: Progressing toward goals    Frequency    7X/week      PT Plan Current plan remains appropriate    Co-evaluation              AM-PAC PT "6 Clicks" Daily Activity  Outcome Measure  Difficulty turning over in bed (including adjusting bedclothes, sheets and blankets)?: None Difficulty moving from lying on back to sitting on the side of the bed? : None Difficulty sitting down on and standing up from a chair with arms (e.g., wheelchair,  bedside commode, etc,.)?: Unable Help needed moving to and from a bed to chair (including a wheelchair)?: A Little Help needed walking in hospital room?: A Little Help needed climbing 3-5 steps with a railing? : A Little 6 Click Score: 18    End of Session Equipment Utilized During Treatment: Gait belt Activity Tolerance: Patient tolerated treatment well Patient left: in chair;with chair alarm set;with call bell/phone within reach Nurse Communication: Mobility status PT Visit Diagnosis: Pain;Other abnormalities of gait and mobility (R26.89) Pain - Right/Left: Right Pain - part of body: Hip     Time: [id number] PT Time Calculation (min) (ACUTE ONLY): 15 min  Charges:  $Gait Training: 8-22 mins                    G Codes:       Etta GrandchildSean Athziri Freundlich, PT, DPT Acute Rehab Services Pager: [phone]     Etta GrandchildSean  Jacquilyn Seldon 08/30/2017, 4:18 PM

## 2017-08-30 NOTE — Discharge Summary (Signed)
Physician Discharge Summary   Patient ID: Austin Boone. MRN: [id number] DOB/AGE: Aug 25, 1955 62 y.o.  Admit date: 08/24/2017 Discharge date: 08/30/2017  Primary Care Physician:  Lonie Peak, PA-C   Recommendations for Outpatient Follow-up:  1. Follow up with PCP in 1-2 weeks 2. Please obtain BMP/CBC in one week  Home Health: Home health PT, OT, RN, aide Equipment/Devices: Rolling walker with 5 inches wheels   Discharge Condition: stable  CODE STATUS: FULL   Diet recommendation: Heart healthy diet   Discharge Diagnoses:    Marland Kitchen Mechanical fall . Displaced fracture of right femoral neck (HCC) Hypertension History of prior hemorrhagic stroke with right-sided residual weakness   Consults: Orthopedics    Allergies:  No Known Allergies   DISCHARGE MEDICATIONS: Allergies as of 08/30/2017   No Known Allergies     Medication List    TAKE these medications   aspirin 81 MG tablet Take 1 tablet (81 mg total) by mouth 2 (two) times daily after a meal.   docusate sodium 100 MG capsule Commonly known as:  COLACE Take 1 capsule (100 mg total) by mouth 2 (two) times daily as needed for mild constipation or moderate constipation.   hydrochlorothiazide 12.5 MG tablet Commonly known as:  HYDRODIURIL Take 12.5 mg by mouth daily.   oxyCODONE 5 MG immediate release tablet Commonly known as:  ROXICODONE Take 1-2 tablets (5-10 mg total) by mouth every 4 (four) hours as needed for severe pain.        Brief H and P: For complete details please refer to admission H and P, but in brief Patient is a 62 year old male with history of hypertension, CVA presented to ED with right hip pain.  Patient had a mechanical fall at home, was found to have displaced right femoral neck fracture.   Hospital Course:    Right hip fracture secondary to mechanical fall Orthopedics was consulted, patient underwent-right total hip arthroplasty on 4/8, postop day 3 -Continue PT OT,  recommended home health.  Patient did not qualify for skilled nursing facility or CIR.  Home health PT OT, RN, aide was arranged by case management.  Patient will live with his sister for a few weeks after discharge -H&H stable,  -Aspirin twice a day for DVT prophylaxis, pain medication prescription given by orthopedics  Hypertension -Continue HCTZ, BP currently soft  History of prior hemorrhagic stroke, limited use of right leg, arm -Continue fall precautions   Day of Discharge S: Feeling well, hoping to go home  BP 96/78 (BP Location: Right Arm)   Pulse 63   Temp 98.3 F (36.8 C) (Oral)   Resp 18   Ht 5' 9.49" (1.765 m)   Wt 59 kg (130 lb)   SpO2 99%   BMI 18.93 kg/m   Physical Exam: General: Alert and awake oriented x3 not in any acute distress. HEENT: anicteric sclera, pupils reactive to light and accommodation CVS: S1-S2 clear no murmur rubs or gallops Chest: clear to auscultation bilaterally, no wheezing rales or rhonchi Abdomen: soft nontender, nondistended, normal bowel sounds Extremities: no cyanosis, clubbing or edema noted bilaterally Neuro: no new deficits   The results of significant diagnostics from this hospitalization (including imaging, microbiology, ancillary and laboratory) are listed below for reference.      Procedures/Studies:  Dg Chest 2 View  Result Date: 08/24/2017 CLINICAL DATA:  Preop for right femur fracture repair. EXAM: CHEST - 2 VIEW COMPARISON:  Radiographs of August 21, 2007. FINDINGS: The heart size and mediastinal contours  are within normal limits. Both lungs are clear. No pneumothorax or pleural effusion is noted. Old left rib fractures are noted. IMPRESSION: No active cardiopulmonary disease. Electronically Signed   By: Lupita Raider, M.D.   On: 08/24/2017 14:56   Pelvis Portable  Result Date: 08/27/2017 CLINICAL DATA:  Status post right hip replacement today. EXAM: PORTABLE PELVIS 1-2 VIEWS COMPARISON:  CT right hip 08/24/2017.  FINDINGS: New right total hip arthroplasty is in place. The device is located. No fracture. Left hip is unremarkable. Gas in the soft tissues related to surgery noted. IMPRESSION: Status post right total hip replacement.  No acute finding. Electronically Signed   By: Drusilla Kanner M.D.   On: 08/27/2017 15:56   Dg Pelvis Portable  Result Date: 08/24/2017 CLINICAL DATA:  Displaced right femoral neck fracture. EXAM: PORTABLE PELVIS 1-2 VIEWS COMPARISON:  08/24/2017 right hip and femur radiographs as well as CT of the right hip. FINDINGS: Patient is slightly oblique this AP portable supine view of the pelvis. Femoral heads are seated within their acetabular components. Redemonstration of known right femoral neck fracture involving the basicervical portion medially with impaction along the superolateral aspect of the femoral head-neck junction. The pubic rami, bony pelvis and left proximal femur are intact. Acetabular components are intact. The degree of minimal offset involving the base of the right femoral neck is less apparent on this study. IMPRESSION: Acute right femoral neck fracture along the basicervical portion medially with impaction of the femoral neck on femoral head superolaterally. No joint dislocation. The degree of offset involving the fracture at the base of the right femoral neck is less conspicuous on this study. Electronically Signed   By: Tollie Eth M.D.   On: 08/24/2017 18:57   Ct Hip Right Wo Contrast  Result Date: 08/24/2017 CLINICAL DATA:  62 year old male status post fall over dog yesterday presents with right hip pain. EXAM: CT OF THE RIGHT HIP WITHOUT CONTRAST TECHNIQUE: Multidetector CT imaging of the right hip was performed according to the standard protocol. Multiplanar CT image reconstructions were also generated. COMPARISON:  Radiographs from 08/24/2017 of the right hip. FINDINGS: Bones/Joint/Cartilage Acute, basicervical fracture of the right femur with impaction of the femoral  neck on femoral head along the superolateral aspect also noted, series 6/39. There is 1.3 mm of lateral displacement of the main distal fracture fragment at the base of the femoral neck in addition. Pubic rami appear intact. Linear lucency with sclerotic margins compatible with a vascular channel is seen of the right superior pubic ramus, series 6/26. The femoral head is seated within its acetabular component. Degenerative acetabular spurring is noted off the of the acetabulum. No hip joint effusion. Intact greater and lesser trochanters. Small focus of fibrocystic change at the femoral head-neck juncture, series 7, image 32 which can be seen as sequela of femoroacetabular impingement. Ligaments Suboptimally assessed by CT. Muscles and Tendons No intramuscular hemorrhage or atrophy. Soft tissues Subcutaneous fatty induration along the lateral aspect of the right hip consistent with a contusion. Partially included right-sided hydrocele within the scrotum. IMPRESSION: 1. Acute comminuted fracture of the right femoral neck with minimally displaced basicervical fracture component as well as impaction of the femoral head upon the femoral head superolaterally. 2. No joint dislocation or joint effusion. 3. Partially included right sided hydrocele within the included scrotal sac. Electronically Signed   By: Tollie Eth M.D.   On: 08/24/2017 18:00   Dg C-arm 1-60 Min  Result Date: 08/27/2017 CLINICAL DATA:  Right  hip replacement. EXAM: DG C-ARM 61-120 MIN; OPERATIVE RIGHT HIP WITH PELVIS COMPARISON:  CT of the right hip and right hip radiographs 08/24/2017. FLUOROSCOPY TIME:  Fluoroscopy Time:  13 seconds Number of Acquired Spot Images: 0 FINDINGS: To AP intraoperative fluoro spot images demonstrate a right total hip arthroplasty. Femoral and acetabular components appear to be in place. No new fractures are present. IMPRESSION: Intraoperative fluoro spot images of right total hip arthroplasty without radiographic evidence  for complication. Electronically Signed   By: Marin Robertshristopher  Mattern M.D.   On: 08/27/2017 17:17   Dg Hip Operative Unilat W Or W/o Pelvis Right  Result Date: 08/27/2017 CLINICAL DATA:  Right hip replacement. EXAM: DG C-ARM 61-120 MIN; OPERATIVE RIGHT HIP WITH PELVIS COMPARISON:  CT of the right hip and right hip radiographs 08/24/2017. FLUOROSCOPY TIME:  Fluoroscopy Time:  13 seconds Number of Acquired Spot Images: 0 FINDINGS: To AP intraoperative fluoro spot images demonstrate a right total hip arthroplasty. Femoral and acetabular components appear to be in place. No new fractures are present. IMPRESSION: Intraoperative fluoro spot images of right total hip arthroplasty without radiographic evidence for complication. Electronically Signed   By: Marin Robertshristopher  Mattern M.D.   On: 08/27/2017 17:17   Dg Hip Unilat With Pelvis 2-3 Views Right  Result Date: 08/24/2017 CLINICAL DATA:  Right hip pain after falling yesterday. EXAM: DG HIP (WITH OR WITHOUT PELVIS) 2-3V RIGHT COMPARISON:  05/27/2005 FINDINGS: Suspicion of minimally displaced to nondisplaced femoral neck fracture. Of suggest confirmation with MRI if able. If not, CT could be second choice. No other abnormality of the right hemipelvis. IMPRESSION: Suspicion of minimally displaced to nondisplaced femoral neck fracture. Recommend confirmation with MRI. If not able, CT would be second choice. Electronically Signed   By: Paulina FusiMark  Shogry M.D.   On: 08/24/2017 12:26   Dg Femur Min 2 Views Right  Result Date: 08/24/2017 CLINICAL DATA:  Right hip pain after fall yesterday. EXAM: RIGHT FEMUR 2 VIEWS COMPARISON:  None. FINDINGS: Minimal displaced fracture is seen involving the proximal right femoral neck. No dislocation is noted. No soft tissue abnormality is noted. IMPRESSION: Minimally displaced proximal right femoral neck fracture. Electronically Signed   By: Lupita RaiderJames  Green Jr, M.D.   On: 08/24/2017 14:57      LAB RESULTS: Basic Metabolic Panel: Recent Labs   Lab 08/27/17 0530 08/28/17 0600 08/29/17 0711  NA 135 134* 136  K 4.0 4.6 3.8  CL 100* 100* 98*  CO2 24 26 28   GLUCOSE 96 128* 99  BUN 9 10 8   CREATININE 0.98 1.07 0.89  CALCIUM 8.8* 9.1 8.8*  MG 2.3  --   --    Liver Function Tests: Recent Labs  Lab 08/27/17 0530 08/29/17 0711  AST 27 54*  ALT 21 75*  ALKPHOS 53 66  BILITOT 1.1 1.1  PROT 6.8 6.3*  ALBUMIN 3.3* 3.0*   No results for input(s): LIPASE, AMYLASE in the last 168 hours. No results for input(s): AMMONIA in the last 168 hours. CBC: Recent Labs  Lab 08/24/17 1351  08/29/17 0711 08/30/17 0308  WBC 11.5*   < > 7.4 7.9  NEUTROABS 8.2*  --   --   --   HGB 12.9*   < > 9.1* 9.1*  HCT 37.2*   < > 26.3* 25.9*  MCV 92.8   < > 91.6 92.2  PLT 198   < > 195 203   < > = values in this interval not displayed.   Cardiac Enzymes: No results  for input(s): CKTOTAL, CKMB, CKMBINDEX, TROPONINI in the last 168 hours. BNP: Invalid input(s): POCBNP CBG: No results for input(s): GLUCAP in the last 168 hours.    Disposition and Follow-up:    DISPOSITION: Home   DISCHARGE FOLLOW-UP Follow-up Information    Swinteck, Arlys John, MD. Schedule an appointment as soon as possible for a visit in 2 weeks.   Specialty:  Orthopedic Surgery Why:  For wound re-check Contact information: [address] [place] [address] [phone]            Time coordinating discharge:  35 minutes  Signed:   Thad Ranger M.D. [facility] 08/30/2017, 12:05 PM Pager: [phone]

## 2018-03-01 DIAGNOSIS — Z96641 Presence of right artificial hip joint: Secondary | ICD-10-CM | POA: Diagnosis not present

## 2018-03-01 DIAGNOSIS — Z23 Encounter for immunization: Secondary | ICD-10-CM | POA: Diagnosis not present

## 2018-03-01 DIAGNOSIS — I10 Essential (primary) hypertension: Secondary | ICD-10-CM | POA: Diagnosis not present

## 2018-03-01 DIAGNOSIS — H9193 Unspecified hearing loss, bilateral: Secondary | ICD-10-CM | POA: Diagnosis not present

## 2018-03-01 DIAGNOSIS — J449 Chronic obstructive pulmonary disease, unspecified: Secondary | ICD-10-CM | POA: Diagnosis not present

## 2018-03-12 DIAGNOSIS — S72001D Fracture of unspecified part of neck of right femur, subsequent encounter for closed fracture with routine healing: Secondary | ICD-10-CM | POA: Diagnosis not present

## 2018-04-22 DIAGNOSIS — Z0000 Encounter for general adult medical examination without abnormal findings: Secondary | ICD-10-CM | POA: Diagnosis not present

## 2020-01-29 IMAGING — DX DG PORTABLE PELVIS
1 series · 1 of 1 positions shown · non-contrast
Comparison: CT right hip 08/24/2017.

CLINICAL DATA: Status post right hip replacement today.

EXAM:
PORTABLE PELVIS 1-2 VIEWS

[pelvis ap]
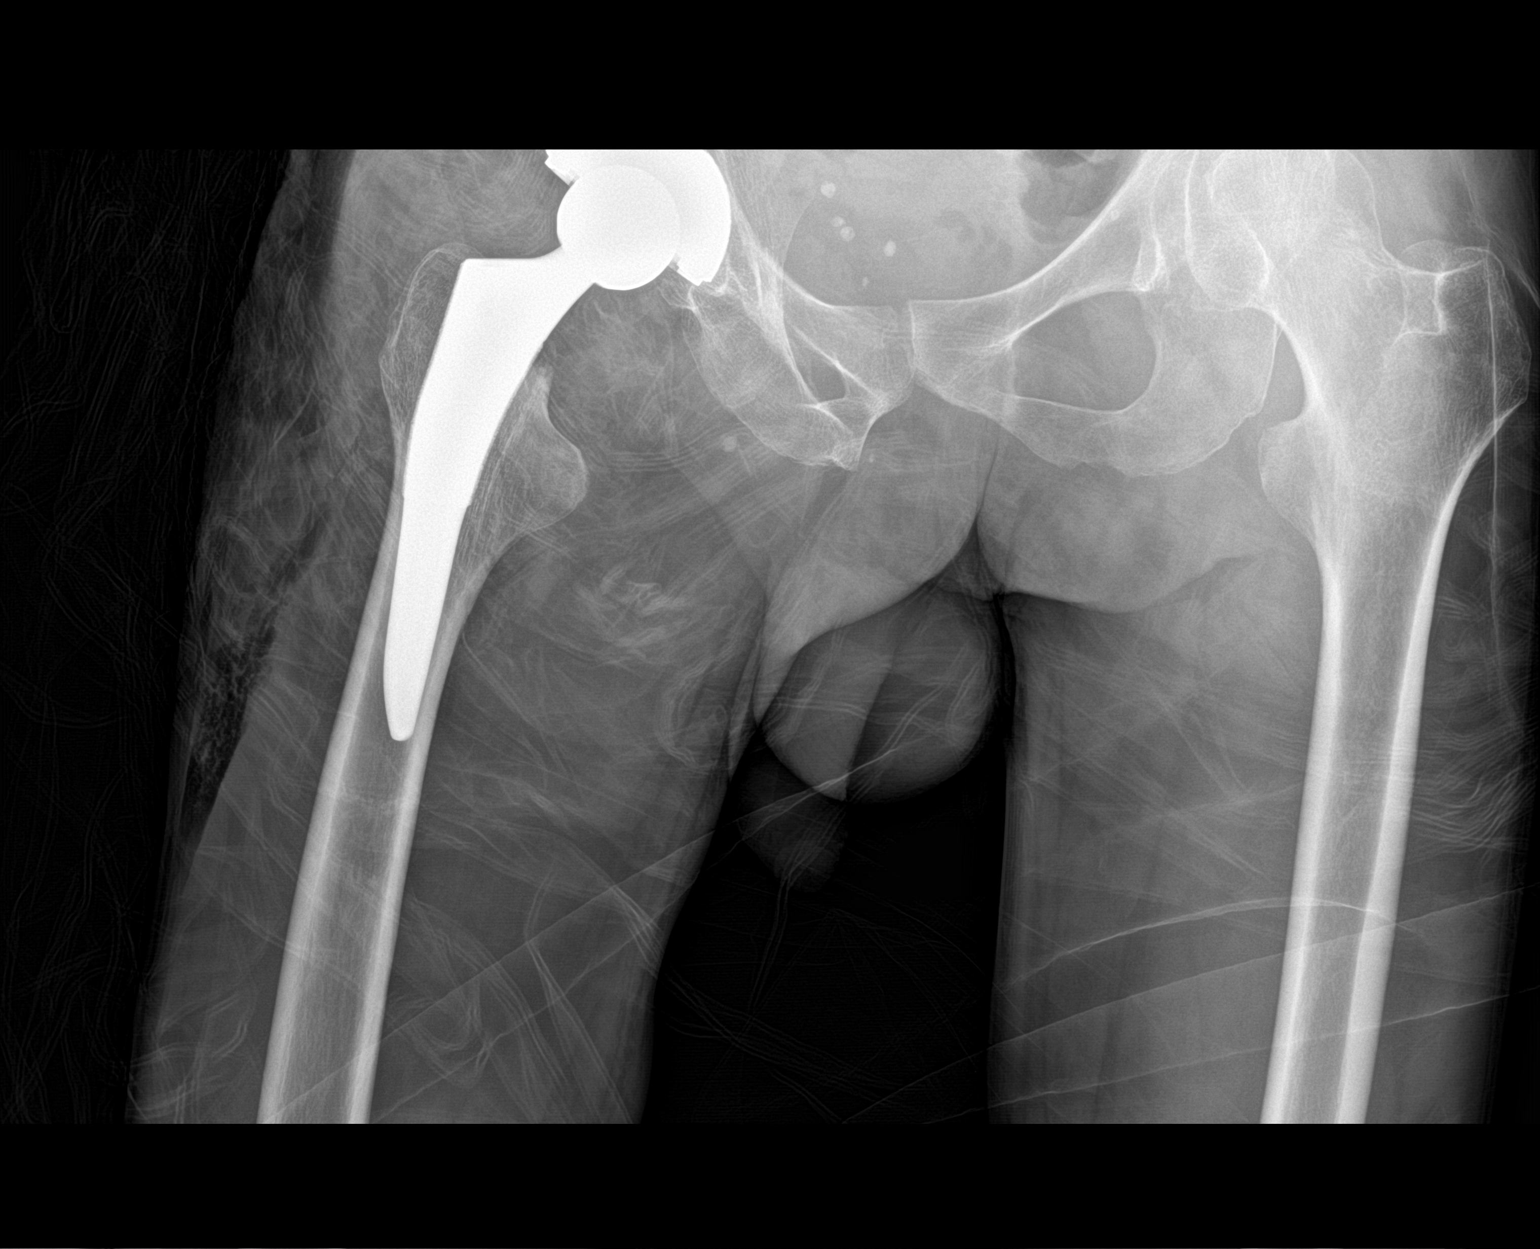

[1 of 1 positions shown; findings below may reference images not displayed]

FINDINGS: New right total hip arthroplasty is in place. The device is located.
No fracture. Left hip is unremarkable. Gas in the soft tissues
related to surgery noted.
IMPRESSION: Status post right total hip replacement.  No acute finding.

## 2022-08-14 DIAGNOSIS — R634 Abnormal weight loss: Secondary | ICD-10-CM | POA: Diagnosis not present

## 2022-08-14 DIAGNOSIS — Z139 Encounter for screening, unspecified: Secondary | ICD-10-CM | POA: Diagnosis not present

## 2022-08-14 DIAGNOSIS — Z9181 History of falling: Secondary | ICD-10-CM | POA: Diagnosis not present

## 2022-08-14 DIAGNOSIS — F17200 Nicotine dependence, unspecified, uncomplicated: Secondary | ICD-10-CM | POA: Diagnosis not present

## 2022-08-14 DIAGNOSIS — Z23 Encounter for immunization: Secondary | ICD-10-CM | POA: Diagnosis not present

## 2022-08-14 DIAGNOSIS — J449 Chronic obstructive pulmonary disease, unspecified: Secondary | ICD-10-CM | POA: Diagnosis not present

## 2023-03-26 DIAGNOSIS — Z681 Body mass index (BMI) 19 or less, adult: Secondary | ICD-10-CM | POA: Diagnosis not present

## 2023-03-26 DIAGNOSIS — S60229A Contusion of unspecified hand, initial encounter: Secondary | ICD-10-CM | POA: Diagnosis not present

## 2023-07-18 DIAGNOSIS — Z8619 Personal history of other infectious and parasitic diseases: Secondary | ICD-10-CM | POA: Diagnosis not present

## 2023-07-18 DIAGNOSIS — J449 Chronic obstructive pulmonary disease, unspecified: Secondary | ICD-10-CM | POA: Diagnosis not present

## 2023-07-18 DIAGNOSIS — Z1211 Encounter for screening for malignant neoplasm of colon: Secondary | ICD-10-CM | POA: Diagnosis not present

## 2023-07-18 DIAGNOSIS — J069 Acute upper respiratory infection, unspecified: Secondary | ICD-10-CM | POA: Diagnosis not present

## 2023-07-18 DIAGNOSIS — Z23 Encounter for immunization: Secondary | ICD-10-CM | POA: Diagnosis not present

## 2023-07-18 DIAGNOSIS — Z87891 Personal history of nicotine dependence: Secondary | ICD-10-CM | POA: Diagnosis not present

## 2023-07-19 ENCOUNTER — Other Ambulatory Visit: Payer: Self-pay | Admitting: Physician Assistant

## 2023-07-19 DIAGNOSIS — Z87891 Personal history of nicotine dependence: Secondary | ICD-10-CM

## 2023-08-02 DIAGNOSIS — Z1211 Encounter for screening for malignant neoplasm of colon: Secondary | ICD-10-CM | POA: Diagnosis not present

## 2023-08-22 DIAGNOSIS — K5730 Diverticulosis of large intestine without perforation or abscess without bleeding: Secondary | ICD-10-CM | POA: Diagnosis not present

## 2023-08-22 DIAGNOSIS — Z1211 Encounter for screening for malignant neoplasm of colon: Secondary | ICD-10-CM | POA: Diagnosis not present

## 2023-09-13 DIAGNOSIS — Z139 Encounter for screening, unspecified: Secondary | ICD-10-CM | POA: Diagnosis not present

## 2023-09-13 DIAGNOSIS — H9190 Unspecified hearing loss, unspecified ear: Secondary | ICD-10-CM | POA: Diagnosis not present

## 2023-09-13 DIAGNOSIS — Z550 Illiteracy and low-level literacy: Secondary | ICD-10-CM | POA: Diagnosis not present

## 2023-09-13 DIAGNOSIS — M79609 Pain in unspecified limb: Secondary | ICD-10-CM | POA: Diagnosis not present

## 2023-09-13 DIAGNOSIS — Z9181 History of falling: Secondary | ICD-10-CM | POA: Diagnosis not present
